# Patient Record
Sex: Female | Born: 1955 | Race: White | Hispanic: No | Marital: Single | Smoking: Never smoker
Health system: Southern US, Community
[De-identification: ages and names within clinical notes are randomized; demographics above are authoritative.]

## PROBLEM LIST (undated history)

## (undated) DIAGNOSIS — R002 Palpitations: Secondary | ICD-10-CM

## (undated) DIAGNOSIS — I319 Disease of pericardium, unspecified: Secondary | ICD-10-CM

## (undated) DIAGNOSIS — B001 Herpesviral vesicular dermatitis: Secondary | ICD-10-CM

## (undated) DIAGNOSIS — J45909 Unspecified asthma, uncomplicated: Secondary | ICD-10-CM

## (undated) DIAGNOSIS — R9389 Abnormal findings on diagnostic imaging of other specified body structures: Secondary | ICD-10-CM

## (undated) DIAGNOSIS — M542 Cervicalgia: Secondary | ICD-10-CM

## (undated) DIAGNOSIS — J309 Allergic rhinitis, unspecified: Secondary | ICD-10-CM

## (undated) DIAGNOSIS — M5030 Other cervical disc degeneration, unspecified cervical region: Secondary | ICD-10-CM

## (undated) DIAGNOSIS — R0789 Other chest pain: Secondary | ICD-10-CM

## (undated) DIAGNOSIS — F32A Depression, unspecified: Secondary | ICD-10-CM

## (undated) DIAGNOSIS — R7401 Elevation of levels of liver transaminase levels: Secondary | ICD-10-CM

## (undated) DIAGNOSIS — E559 Vitamin D deficiency, unspecified: Secondary | ICD-10-CM

## (undated) DIAGNOSIS — I4510 Unspecified right bundle-branch block: Secondary | ICD-10-CM

## (undated) DIAGNOSIS — E785 Hyperlipidemia, unspecified: Secondary | ICD-10-CM

## (undated) DIAGNOSIS — G4700 Insomnia, unspecified: Secondary | ICD-10-CM

## (undated) DIAGNOSIS — K760 Fatty (change of) liver, not elsewhere classified: Secondary | ICD-10-CM

## (undated) DIAGNOSIS — L719 Rosacea, unspecified: Secondary | ICD-10-CM

## (undated) DIAGNOSIS — R7989 Other specified abnormal findings of blood chemistry: Secondary | ICD-10-CM

## (undated) DIAGNOSIS — M8580 Other specified disorders of bone density and structure, unspecified site: Secondary | ICD-10-CM

## (undated) HISTORY — DX: Unspecified right bundle-branch block: I45.10

## (undated) HISTORY — DX: Elevation of levels of liver transaminase levels: R74.01

## (undated) HISTORY — DX: Vitamin D deficiency, unspecified: E55.9

## (undated) HISTORY — DX: Abnormal findings on diagnostic imaging of other specified body structures: R93.89

## (undated) HISTORY — DX: Allergic rhinitis, unspecified: J30.9

## (undated) HISTORY — DX: Palpitations: R00.2

## (undated) HISTORY — DX: Cervicalgia: M54.2

## (undated) HISTORY — DX: Insomnia, unspecified: G47.00

## (undated) HISTORY — DX: Rosacea, unspecified: L71.9

## (undated) HISTORY — DX: Other specified disorders of bone density and structure, unspecified site: M85.80

## (undated) HISTORY — DX: Hyperlipidemia, unspecified: E78.5

## (undated) HISTORY — DX: Other specified abnormal findings of blood chemistry: R79.89

## (undated) HISTORY — DX: Other cervical disc degeneration, unspecified cervical region: M50.30

## (undated) HISTORY — DX: Unspecified asthma, uncomplicated: J45.909

## (undated) HISTORY — DX: Other chest pain: R07.89

## (undated) HISTORY — DX: Depression, unspecified: F32.A

## (undated) HISTORY — DX: Disease of pericardium, unspecified: I31.9

## (undated) HISTORY — DX: Herpesviral vesicular dermatitis: B00.1

## (undated) HISTORY — DX: Fatty (change of) liver, not elsewhere classified: K76.0

## (undated) HISTORY — PX: LOBECTOMY: SHX5089

---

## 1997-07-10 ENCOUNTER — Ambulatory Visit (HOSPITAL_COMMUNITY): Admission: RE | Admit: 1997-07-10 | Discharge: 1997-07-10 | Payer: Self-pay | Admitting: Cardiology

## 1997-07-20 ENCOUNTER — Ambulatory Visit (HOSPITAL_COMMUNITY): Admission: RE | Admit: 1997-07-20 | Discharge: 1997-07-20 | Payer: Self-pay | Admitting: Cardiology

## 1999-01-07 ENCOUNTER — Encounter: Admission: RE | Admit: 1999-01-07 | Discharge: 1999-01-07 | Payer: Self-pay | Admitting: Family Medicine

## 1999-06-30 ENCOUNTER — Ambulatory Visit: Admission: RE | Admit: 1999-06-30 | Discharge: 1999-06-30 | Payer: Self-pay | Admitting: Family Medicine

## 1999-10-19 ENCOUNTER — Ambulatory Visit (HOSPITAL_COMMUNITY): Admission: RE | Admit: 1999-10-19 | Discharge: 1999-10-19 | Payer: Self-pay | Admitting: Family Medicine

## 1999-10-19 ENCOUNTER — Encounter: Payer: Self-pay | Admitting: Family Medicine

## 1999-11-02 ENCOUNTER — Other Ambulatory Visit: Admission: RE | Admit: 1999-11-02 | Discharge: 1999-11-02 | Payer: Self-pay | Admitting: *Deleted

## 1999-11-07 ENCOUNTER — Encounter (INDEPENDENT_AMBULATORY_CARE_PROVIDER_SITE_OTHER): Payer: Self-pay

## 1999-11-08 ENCOUNTER — Ambulatory Visit (HOSPITAL_COMMUNITY): Admission: RE | Admit: 1999-11-08 | Discharge: 1999-11-08 | Payer: Self-pay | Admitting: *Deleted

## 2000-01-10 ENCOUNTER — Encounter: Admission: RE | Admit: 2000-01-10 | Discharge: 2000-01-10 | Payer: Self-pay | Admitting: Occupational Medicine

## 2000-01-10 ENCOUNTER — Encounter: Payer: Self-pay | Admitting: Occupational Medicine

## 2000-01-16 ENCOUNTER — Encounter (INDEPENDENT_AMBULATORY_CARE_PROVIDER_SITE_OTHER): Payer: Self-pay | Admitting: Specialist

## 2000-01-17 ENCOUNTER — Inpatient Hospital Stay (HOSPITAL_COMMUNITY): Admission: RE | Admit: 2000-01-17 | Discharge: 2000-01-18 | Payer: Self-pay | Admitting: *Deleted

## 2000-01-17 ENCOUNTER — Encounter: Payer: Self-pay | Admitting: *Deleted

## 2001-01-10 ENCOUNTER — Encounter: Payer: Self-pay | Admitting: Occupational Medicine

## 2001-01-10 ENCOUNTER — Encounter: Admission: RE | Admit: 2001-01-10 | Discharge: 2001-01-10 | Payer: Self-pay | Admitting: Occupational Medicine

## 2002-01-16 ENCOUNTER — Encounter: Payer: Self-pay | Admitting: Family Medicine

## 2002-01-16 ENCOUNTER — Encounter: Admission: RE | Admit: 2002-01-16 | Discharge: 2002-01-16 | Payer: Self-pay | Admitting: Family Medicine

## 2003-04-15 ENCOUNTER — Encounter: Admission: RE | Admit: 2003-04-15 | Discharge: 2003-04-15 | Payer: Self-pay | Admitting: Occupational Medicine

## 2003-09-08 ENCOUNTER — Encounter: Admission: RE | Admit: 2003-09-08 | Discharge: 2003-09-08 | Payer: Self-pay | Admitting: Cardiology

## 2004-05-04 ENCOUNTER — Encounter: Admission: RE | Admit: 2004-05-04 | Discharge: 2004-05-04 | Payer: Self-pay | Admitting: Emergency Medicine

## 2004-08-29 ENCOUNTER — Ambulatory Visit (HOSPITAL_COMMUNITY): Admission: RE | Admit: 2004-08-29 | Discharge: 2004-08-30 | Payer: Self-pay | Admitting: Neurosurgery

## 2005-05-05 ENCOUNTER — Encounter: Admission: RE | Admit: 2005-05-05 | Discharge: 2005-05-05 | Payer: Self-pay | Admitting: Emergency Medicine

## 2005-05-26 ENCOUNTER — Encounter: Admission: RE | Admit: 2005-05-26 | Discharge: 2005-05-26 | Payer: Self-pay | Admitting: Emergency Medicine

## 2005-06-09 ENCOUNTER — Encounter: Admission: RE | Admit: 2005-06-09 | Discharge: 2005-06-09 | Payer: Self-pay | Admitting: Emergency Medicine

## 2006-04-04 ENCOUNTER — Encounter: Admission: RE | Admit: 2006-04-04 | Discharge: 2006-04-04 | Payer: Self-pay | Admitting: Family Medicine

## 2006-07-05 ENCOUNTER — Encounter: Admission: RE | Admit: 2006-07-05 | Discharge: 2006-07-05 | Payer: Self-pay | Admitting: Family Medicine

## 2006-11-09 ENCOUNTER — Encounter: Admission: RE | Admit: 2006-11-09 | Discharge: 2006-11-09 | Payer: Self-pay | Admitting: Neurosurgery

## 2007-04-08 ENCOUNTER — Encounter: Admission: RE | Admit: 2007-04-08 | Discharge: 2007-04-08 | Payer: Self-pay | Admitting: Family Medicine

## 2007-07-18 ENCOUNTER — Encounter: Admission: RE | Admit: 2007-07-18 | Discharge: 2007-07-18 | Payer: Self-pay | Admitting: Family Medicine

## 2008-02-06 ENCOUNTER — Ambulatory Visit (HOSPITAL_COMMUNITY): Admission: RE | Admit: 2008-02-06 | Discharge: 2008-02-07 | Payer: Self-pay | Admitting: Neurosurgery

## 2009-01-01 ENCOUNTER — Encounter: Admission: RE | Admit: 2009-01-01 | Discharge: 2009-01-01 | Payer: Self-pay | Admitting: Family Medicine

## 2010-01-07 ENCOUNTER — Encounter: Admission: RE | Admit: 2010-01-07 | Discharge: 2010-01-07 | Payer: Self-pay | Admitting: Family Medicine

## 2010-04-17 ENCOUNTER — Encounter: Payer: Self-pay | Admitting: Emergency Medicine

## 2010-08-09 NOTE — Op Note (Signed)
Sharon Obrien, Sharon Obrien                ACCOUNT NO.:  [id number]   MEDICAL RECORD NO.:  [id number]          PATIENT TYPE:  OIB   LOCATION:  3526                         FACILITY:  [facility]   PHYSICIAN:  Cristi Loron, M.D.DATE OF BIRTH:  1955-09-09   DATE OF PROCEDURE:  02/06/2008  DATE OF DISCHARGE:                               OPERATIVE REPORT   BRIEF HISTORY:  The patient is a 55 year old white female who I  performed a previous C5-6 anterior cervical discectomy and fusion  plating.  The patient has done well for years, but more recently has  developed chronic debilitating neck pain.  She failed medical  management.  She was worked up with a cervical MRI which demonstrated  that she had degenerative changes at C4-5 with predominantly facet  arthropathy and some spondylosis.  I discussed various treatment option  with the patient including surgery.  She has weighed the risks,  benefits, and alternatives of surgery and decided to pursue with C4-5  anterior cervical diskectomy and fusion plating.   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   PROCEDURE:  C4-5 extensive anterior cervical diskectomy/decompression;  C4-5 anterior interbody arthrodesis with local morselized autograft bone  and active fused bone graft extender; insertion of C4-C5 interbody  prosthesis (Alphatec PEEK interbody prosthesis); C4-5 anterior cervical  plating with Codman Slim-Loc titanium plate and screws.   SURGEON:  Cristi Loron, MD   ASSISTANT:  Hilda Lias, MD   ANESTHESIA:  General endotracheal.   ESTIMATED BLOOD LOSS:  50 mL.   SPECIMENS:  None.   DRAINS:  None.   COMPLICATIONS:  None.   DESCRIPTION OF PROCEDURE:  The patient was brought to the operating room  by the  anesthesia team.  General endotracheal anesthesia was induced.  The patient remained in supine position.  A roll was placed under her  shoulders and placed the neck in slight extension.  Her anterior  cervical region was then prepared with Betadine scrub and Betadine  solution.  Sterile drapes were applied.  I then injected the area to be  incised with Marcaine with epinephrine solution.  I used a scalpel to  make a transverse incision in the patient's left anterior neck through  her previous surgical scar.  I used a Metzenbaum scissors to divide the  platysma muscle and then to carefully dissect through the previous scar  tissue from the previous operation.  We carefully dissected medial to  sternocleidomastoid muscle, jugular vein, and carotid artery.  I  carefully dissected down towards the anterior cervical spine identifying  the esophagus and retracting it medially.  We then used skin swabs to  clear soft tissue from the anterior cervical spine and exposing previous  plate at Z6-1.  We removed the scar tissue over the plate using the  nerve hooks and the 15-blade scalpel.  We then unlocked the cams to  remove the screws and then remove the plate.  There was a good  arthrodesis at this  level.   We now turned attention to decompression at C4-5.  We used  electrocautery to detach the medial border of the longus colli muscle  bilaterally from C4-5 intervertebral space.  We then inserted the Caspar  self-retaining retractor underneath the longus colli muscle bilaterally  to provide exposure.  We then began the decompression by incising the C4-  5 intervertebral disk.  It was collapsed and spondylotic.  We performed  a partial intervertebral diskectomy using the pituitary forceps and the  Karlin curettes.  We then inserted distraction screws at C4 and C5,  distracted interspace and then used high-speed drill to decorticate the  vertebral plates at O1-3, drill away the remainder C4-5  intervertebral  disk, to drill away some posterior spondylosis and to thin out the  postlongitudinal ligament.  We then incised the ligament with arachnoid  knife and then removed it with a Kerrison punch undercutting the  vertebral endplates decompressing the thecal sac.  We then performed  foraminotomies about the bilateral C5 nerve root completing the  decompression at this level.   We now turned attention to arthrodesis.  We used a trial spacers and  determined to use a medium 5-mm PEEK interbody prosthesis.  We prefilled  this prosthesis with a combination of local autograft bone we obtained  during the decompression as well as active fused bone graft extender.  We inserted the prosthesis into the distracted C4-5 interspace and then  removed distraction screws.  There was a good snug fit of the prosthesis  in the interspace.   We now turned our attention to the anterior spinal instrumentation.  We  used a high-speed drill to remove some ventral spondylosis from the  vertebral endplates of C4-5, so that the plate would lay down flat.  We  selected the appropriate length Codman Slim-Loc anterior cervical plate  laid along the anterior aspect of her vertebral bodies at C4 and C5.  We  used the old screw holes at C5 and inserted two 12-mm self-tapping  screws at C5.  We then drilled two new 12-mm holes at C4 and then placed  two 12-mm self-tapping screws at C4.  We obtained intraoperative  radiograph that demonstrated good position of plate, screws interbody  prosthesis.  We therefore secured these screws to the plate by locking  each cam.   We then obtained hemostasis using bipolar electrocautery.  We irrigated  the wound out with bacitracin solution.  We then removed the retractors.  We inspected the esophagus for any damage and there was none apparent.  We then reapproximated the patient's platysma muscle with interrupted 3-  0 Vicryl suture, subcutaneous tissue with interrupted  3-0 Vicryl suture,  and the skin with Steri-Strips and Benzoin.  The wound was then covered  with bacitracin ointment.  A sterile dressing was applied.  The drapes  were removed and the patient was subsequently extubated by the  Anesthesia team and transported to Post Anesthesia Care Unit in stable  condition.  All sponge, instrument, and needle counts were correct at  the end of this case.      Cristi Loron, M.D.  Electronically Signed     JDJ/MEDQ  D:  02/06/2008  T:  02/07/2008  Job:  [id number]
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
## 2010-08-12 NOTE — Op Note (Signed)
NAMEGEORGENE, Sharon Obrien                ACCOUNT NO.:  [id number]   MEDICAL RECORD NO.:  [id number]          PATIENT TYPE:  OIB   LOCATION:  3015                         FACILITY:  [facility]   PHYSICIAN:  Cristi Loron, M.D.DATE OF BIRTH:  1955-06-30   DATE OF PROCEDURE:  08/29/2004  DATE OF DISCHARGE:                                 OPERATIVE REPORT   BRIEF HISTORY:  The patient is a 55 year old white female who suffers from  neck and left arm pain.  She has failed medical management and was worked up  with a cervical MRI which demonstrated cervical spondylosis and stenosis at  C5-C6.  I discussed the various treatment options with the patient including  surgery.  The patient has weighed the risks, benefits, and alternatives of  surgery and decided to proceed with a C5-C6 anterior cervical discectomy,  fusion, and plating.   PREOPERATIVE DIAGNOSIS:  C5-C6 disc degeneration, spondylosis, stenosis,  cervical radiculopathy, cervicalgia.   POSTOPERATIVE DIAGNOSIS:  C5-C6 disc degeneration, spondylosis, stenosis,  cervical radiculopathy, cervicalgia.   PROCEDURE:  C5-C6 extensive anterior cervical discectomy/decompression;  interbody iliac crest allograft arthrodesis; anterior cervical plating  (Codman Slimlock titanium plate and screws).   SURGEON:  Cristi Loron, M.D.   ASSISTANT:  Hilda Lias, M.D.   ANESTHESIA:  General endotracheal anesthesia.   ESTIMATED BLOOD LOSS:  50 mL.   SPECIMENS:  None.   DRAINS:  None.   COMPLICATIONS:  None.   DESCRIPTION OF PROCEDURE:  The patient was brought to the operating room by  the anesthesia team.  General endotracheal anesthesia was induced.  The  patient remained in the supine position.  A roll was placed under her  shoulders to place her neck in slight extension.  Her anterior cervical  region was then prepared with Betadine scrub and Betadine solution.  Sterile  drapes were applied.  I then injected the area to be incised  with Marcaine  with epinephrine solution.  I used a scalpel to make a transverse incision  in the patient's left anterior neck.  I used the Metzenbaum scissors to  divide the platysmal muscle and to dissect medial to the sternocleidomastoid  muscle, jugular vein, and carotid artery.  I bluntly dissected down towards  the anterior cervical spine, carefully identified the esophagus and  retracted it medially.  I cleared the soft tissue from the anterior cervical  spine and inserted a bent spinal needle into the upper exposed  intravertebral disc space.  I obtained an interoperative radiograph to  confirm our location.   We then used electrocautery to detach the medial border of the longus colli  muscles bilaterally from C5-C6 intervertebral disc space.  We inserted the  Caspar self-retaining retractor for exposure.  We incised the C5-C6  intervertebral disc and performed a partial discectomy using Carlens curets  and pituitary forceps.  We then inserted distraction screws at C5 and C6 and  distracted the interspace.  We used the high speed drill to decorticate the  vertebral endplates at C5-C6, drill away the remainder of the C5-C6  intervertebral disc, and drill away some posterior  spondylosis, and to thin  out the posterior longitudinal ligament.  We then incised the ligament with  the arachnoid knife and then removed it with the Kerrison punch under  cutting the vertebral endplate at C5-C6, decompressing the thecal sac.  We  then performed a generous foraminotomy about the bilateral C6 nerve root.  Of note, there was significant foraminal stenosis secondary to spondylosis.  We got a good decompression of the C6 nerve root bilaterally completing the  decompression.   We now turned our attention to the arthrodesis.  We obtained iliac crest  tricortical allograft bone graft and fashioned it to the dimensions of 7 mm  height and 1 cm depth.  We inserted the bone graft in the distracted  C5-C6  interspace, removed the distraction screws, there was a good snug fit of  bone graft.   We now turned our attention to the anterior spine instrumentation.  We used  a high speed drill to drill away some ventral spondylosis from the C5-C6  intervertebral disc space so that the plate would lie flat.  We selected the  appropriate length Codman Slimlock anterior cervical plate, laid it on the  anterior aspect of C5 to C6, we drilled two holes into C5 and two at C6.  We  secured the plate to the vertebral bodies by placing two 12 mm self-tapping  screws at C5, two at C6.  We obtained an interoperative radiograph.  There  was limited visualization of the lower plate and screws because of the  patient's body habitus, however, they looked good en vivo.  We secured the  screws to the plate by locking each cam and completing the instrumentation.   We then obtained stringent hemostasis using bipolar electrocautery.  We  copiously irrigated the wound out with Bacitracin solution and removed the  solution.  We then removed the Caspar self-retaining retractor.  We  inspected the esophagus for any damage and there was none apparent.  We then  reapproximated the patient's platysmal muscle with interrupted 3-0 Vicryl  suture, the subcutaneous tissue with interrupted 3-0 Vicryl suture, and the  skin with Steri-Strips and Benzoin.  The wound was then coated with  Bacitracin ointment.  A sterile dressing was applied, the drapes were  removed.  The patient was subsequently extubated by the anesthesia team and  transported to the post anesthesia care unit in stable condition.  All  sponge, instrument and needle counts were correct at the end of the case.       JDJ/MEDQ  D:  08/29/2004  T:  08/29/2004  Job:  [id number]

## 2010-08-12 NOTE — Op Note (Signed)
[facility]  Patient:    Sharon Obrien, Sharon Obrien                       MRN: [id number] Proc. Date: 01/16/00 Adm. Date:  [id number] Attending:  Marina Gravel B                           Operative Report  PREOPERATIVE DIAGNOSIS:       Menorrhagia, unresponsive to medical and                               conservative surgical therapy.  POSTOPERATIVE DIAGNOSIS:      Menorrhagia, unresponsive to medical and                               conservative surgical therapy.  OPERATION:                    Laparoscopically-assisted vaginal hysterectomy.  SURGEON:                      Marina Gravel, M.D.  ASSISTANT:                    Lenoard Aden, M.D.  ANESTHESIA:                   General.  FINDINGS:                     Exam under anesthesia, slightly enlarged, approximately 8-week size uterus with smooth contours.  No adnexal masses palpable.  Rectovaginal exam confirmed.  OPERATIVE FINDINGS:           Homogeneously enlarged uterus, approximately eight weeks size.  Adhesions of the bladder to the anterior lower uterine segment.  Normal tubes and ovaries.  No other pelvic adhesions.  Omental adhesions to the stomach and right upper quadrant.  ESTIMATED BLOOD LOSS:         250 cc.  URINE OUTPUT:                 350 cc.  FLUIDS:                       2200 cc.  COMPLICATIONS:                None.  DRAINS:                       Foley.  DESCRIPTION OF PROCEDURE:     The patient was taken to the operating room and general anesthesia obtained.  She was placed in the ski position, and prepped and draped in the standard fashion.  A Foley catheter was inserted in the bladder.  A speculum was inserted in the vagina and the anterior lip of the cervix grasped with a tooth tenaculum.  A Hulka tenaculum was then inserted. All other instruments were then removed from the vagina.                                Attention was then turned to the abdomen where a 10 mm  vertical infraumbilical skin fold incision was made with the knife. This was carried  sharply to the underlying fascia.  The fascia was elevated with Kocher clamps and entered sharply.  The angles of the fascial incision were sutured with 0 Vicryl in a pursestring fashion.  The Hasson cannula was then inserted and the angled suture attached to stabilize the cannula. Intra-abdominal placement was confirmed with the laparoscope. Pneumoperitoneum was obtained with CO2 gas.                                Five millimeter ports were inserted bilaterally, approximately 3 cm off the symphysis and 8 cm off the midline.  Each was performed under direct visualization with the laparoscope.                                The left round ligament was identified and placed on traction.  It was divided with the tripolar cautery.  The dissection was continued anteriorly to begin to develop the bladder flap.  A similar procedure was repeated on the opposite side.  The utero-ovarian ligaments were then isolated, and cauterized, and divided with the tripolar.  Hemostasis was obtained.  The bladder flap was then created with sharp dissection with Endoshears.  Adhesions were noted from the bladder to the anterior lower uterine segment.  These were dissected sharply without difficulty.  We then decided to proceed with the vaginal portion of the procedure.  Therefore, the scope was removed and gas released from the abdomen.                                Attention was then turned to the vagina.  A weighted speculum was inserted and the Hulka tenaculum removed.  The anterior and posterior lip of the cervix were each grasped with a Jacobs tenaculum. The vaginal mucosa was circumscribed with the Bovie just below the level of the internal cervical os.  The vagina and underlying tissue were dissected sharply and the bladder moved caudad.                                The posterior cul-de-sac was then  entered sharply.  A weighted speculum was then inserted into the cul-de-sac.  Each uterosacral ligament was then clamped with a curved Heaney clamp, divided, and suture ligated with 0 Vicryl, and hemostasis obtained.  The cardinal ligaments were then clamped on each side with curved Heaney clamp, divided, and suture ligated with 0 Vicryl, and hemostasis obtained.  The anterior cul-de-sac was then entered sharply, and a curved Beaver retractor placed into the anterior cul-de-sac.  The remainder of the cardinal ligaments were then serially clamped, divided, and suture ligated with 0 Vicryl, and hemostasis obtained. This was continued to the level of the previously dissected utero-ovarian ligaments.  The uterus and cervix were then delivered as an intact specimen. Vaginal angles were then closed to incorporate the uterosacral ligament with a figure-of-eight suture of 0 Vicryl.  The remainder of the vaginal cuff was closed with figure-of-eight sutures of 0 Vicryl.  Hemostasis was obtained.  A 2 inch gauze coated in estrogen cream was then packed into the vagina.  Gloves were changed and attention returned to the abdomen.  Pneumoperitoneum was reobtained with CO2 gas.  The vaginal cuff and utero-ovarian pedicles were inspected and were hemostatic.  The pelvis was irrigated and no other bleeding identified.                                Of note, prior to the dissection of the utero-ovarian ligaments laparoscopically, the course of the ureters was identified on each side, and were noted to be well away from the area of dissection.                                The inferior trocars were removed under laparoscopic visualization.  Their sites were hemostatic.  The scope was removed and gas released.  The scope was reinserted and the scope and sleeve removed as a single unit.  A finger was inserted through the fascial defect and there was no bowel palpable in the  fascia.  The previously placed pursestring suture was then cinched down to close the fascial defect. Subcutaneous 4-0 interrupted sutures were placed in the umbilical port to  reapproximate the skin edges.  The skin was then closed with Dermabond.  The inferior port incisions were then closed as follows; the edges reapproximated with pickups with teeth, and the skin closed with Dermabond.                                The patient tolerated the procedure well and there were complications.  She was taken to the recovery room, awake, alert, and in stable condition.  All counts were correct per the operating room staff. DD:  01/16/00 TD:  01/16/00 Job: [id number]
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
## 2010-08-12 NOTE — Op Note (Signed)
[facility]  Patient:    Sharon Obrien, Sharon Obrien                       MRN: [id number] Proc. Date: 11/08/99 Adm. Date:  [id number] Attending:  Marina Gravel B                           Operative Report  PREOPERATIVE DIAGNOSIS:       Abnormal uterine bleeding, thickened endometrial                               stripe, and ultrasound findings consistent with                               endometrial polyp.  POSTOPERATIVE DIAGNOSIS:      Shaggy endometrium and probable endometrial                               polyp.  OPERATION:                    Hysteroscopy and D&C.  SURGEON:                      Marina Gravel, M.D.  ANESTHESIA:                   LMA.  FINDINGS:                     Shaggy endometrium, probable endometrial polyp.  COMPLICATIONS:                None.  ESTIMATED BLOOD LOSS:         50 cc.  DISTENTION MEDIA:             Sorbitol.  FLUID DEFICIT:                90 cc.  DESCRIPTION OF PROCEDURE:     The patient was taken to the operating room and LMA anesthesia obtained.  She was placed in the ski position, and prepped and draped in a standard fashion.  The bladder was emptied with a straight catheter.  The patient was examined under anesthesia and a normal size anteverted uterus was palpable.  No adnexal masses were noted.                                A weight speculum was placed in the vagina and the anterior lip of the cervix was grasped with a tooth tenaculum.  The cervix was then dilated without difficulty to allow passage of the [facility] diagnostic hysteroscope.  The hysteroscope was inserted and the uterine cavity distended with sorbitol.  The above findings were noted.  Polyp forceps were used to remove the endometrial polyp noted at approximately 9 oclock position, just distal to the internal cervical os.  Then, the uterus was gently curetted until a gritty texture was noted throughout.  The hysteroscope was returned and the  previous polyp was noted to be removed.  No additional intrauterine masses were seen.  A slight amount of shaggy endometrium remained.  The hysteroscope was removed and curet passed one more time and a  gritty texture noted again.  Therefore, the procedure was terminated.                                The patient tolerated the procedure well and there were no complications.  She was taken to the recovery room awake, alert, and in stable condition. DD:  11/08/99 TD:  11/08/99 Job: [id number]
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
## 2010-09-11 ENCOUNTER — Emergency Department (HOSPITAL_COMMUNITY)
Admission: EM | Admit: 2010-09-11 | Discharge: 2010-09-11 | Disposition: A | Discharge status: Home / Self Care | Payer: Federal, State, Local not specified - PPO | Attending: Emergency Medicine | Admitting: Emergency Medicine

## 2010-09-11 ENCOUNTER — Emergency Department (HOSPITAL_COMMUNITY): Payer: Federal, State, Local not specified - PPO

## 2010-09-11 DIAGNOSIS — IMO0002 Reserved for concepts with insufficient information to code with codable children: Secondary | ICD-10-CM | POA: Insufficient documentation

## 2010-09-11 DIAGNOSIS — M5430 Sciatica, unspecified side: Secondary | ICD-10-CM | POA: Insufficient documentation

## 2010-09-11 DIAGNOSIS — M549 Dorsalgia, unspecified: Secondary | ICD-10-CM | POA: Insufficient documentation

## 2010-09-11 DIAGNOSIS — M79609 Pain in unspecified limb: Secondary | ICD-10-CM | POA: Insufficient documentation

## 2010-09-11 DIAGNOSIS — R209 Unspecified disturbances of skin sensation: Secondary | ICD-10-CM | POA: Insufficient documentation

## 2010-12-27 LAB — BASIC METABOLIC PANEL
Calcium: 9.4
Chloride: 103
GFR calc non Af Amer: >60
Glucose, Bld: 86
Potassium: 4.5
Sodium: 138

## 2011-04-24 ENCOUNTER — Other Ambulatory Visit: Payer: Self-pay | Admitting: Family Medicine

## 2011-04-24 DIAGNOSIS — R945 Abnormal results of liver function studies: Secondary | ICD-10-CM

## 2011-04-27 ENCOUNTER — Other Ambulatory Visit: Payer: Self-pay | Admitting: Family Medicine

## 2011-04-27 ENCOUNTER — Ambulatory Visit
Admission: RE | Admit: 2011-04-27 | Discharge: 2011-04-27 | Disposition: A | Discharge status: Home / Self Care | Payer: Federal, State, Local not specified - PPO | Source: Ambulatory Visit | Attending: Family Medicine | Admitting: Family Medicine

## 2011-04-27 DIAGNOSIS — R945 Abnormal results of liver function studies: Secondary | ICD-10-CM

## 2011-05-15 ENCOUNTER — Other Ambulatory Visit: Payer: Self-pay | Admitting: Physical Medicine and Rehabilitation

## 2011-05-15 DIAGNOSIS — M545 Low back pain, unspecified: Secondary | ICD-10-CM

## 2011-05-17 ENCOUNTER — Ambulatory Visit
Admission: RE | Admit: 2011-05-17 | Discharge: 2011-05-17 | Disposition: A | Discharge status: Home / Self Care | Payer: Federal, State, Local not specified - PPO | Source: Ambulatory Visit | Attending: Physical Medicine and Rehabilitation | Admitting: Physical Medicine and Rehabilitation

## 2011-05-17 DIAGNOSIS — M545 Low back pain, unspecified: Secondary | ICD-10-CM

## 2012-01-16 ENCOUNTER — Other Ambulatory Visit: Payer: Self-pay | Admitting: Family Medicine

## 2012-01-16 DIAGNOSIS — Z1231 Encounter for screening mammogram for malignant neoplasm of breast: Secondary | ICD-10-CM

## 2012-02-20 ENCOUNTER — Ambulatory Visit
Admission: RE | Admit: 2012-02-20 | Discharge: 2012-02-20 | Disposition: A | Discharge status: Home / Self Care | Payer: Federal, State, Local not specified - PPO | Source: Ambulatory Visit | Attending: Family Medicine | Admitting: Family Medicine

## 2012-02-20 DIAGNOSIS — Z1231 Encounter for screening mammogram for malignant neoplasm of breast: Secondary | ICD-10-CM

## 2012-02-21 ENCOUNTER — Ambulatory Visit: Payer: Federal, State, Local not specified - PPO

## 2012-04-15 ENCOUNTER — Other Ambulatory Visit: Payer: Self-pay | Admitting: Internal Medicine

## 2012-04-16 ENCOUNTER — Other Ambulatory Visit: Payer: Self-pay | Admitting: Internal Medicine

## 2012-04-16 DIAGNOSIS — R7401 Elevation of levels of liver transaminase levels: Secondary | ICD-10-CM

## 2012-05-07 ENCOUNTER — Ambulatory Visit
Admission: RE | Admit: 2012-05-07 | Discharge: 2012-05-07 | Disposition: A | Discharge status: Home / Self Care | Payer: Federal, State, Local not specified - PPO | Source: Ambulatory Visit | Attending: Internal Medicine | Admitting: Internal Medicine

## 2012-05-07 DIAGNOSIS — R7401 Elevation of levels of liver transaminase levels: Secondary | ICD-10-CM

## 2013-07-02 ENCOUNTER — Other Ambulatory Visit: Payer: Self-pay

## 2013-07-02 DIAGNOSIS — Z803 Family history of malignant neoplasm of breast: Secondary | ICD-10-CM

## 2013-07-02 DIAGNOSIS — Z1231 Encounter for screening mammogram for malignant neoplasm of breast: Secondary | ICD-10-CM

## 2013-07-23 ENCOUNTER — Encounter (INDEPENDENT_AMBULATORY_CARE_PROVIDER_SITE_OTHER): Payer: Self-pay

## 2013-07-23 ENCOUNTER — Ambulatory Visit
Admission: RE | Admit: 2013-07-23 | Discharge: 2013-07-23 | Disposition: A | Discharge status: Home / Self Care | Payer: Federal, State, Local not specified - PPO | Source: Ambulatory Visit

## 2013-07-23 DIAGNOSIS — Z1231 Encounter for screening mammogram for malignant neoplasm of breast: Secondary | ICD-10-CM

## 2013-07-23 DIAGNOSIS — Z803 Family history of malignant neoplasm of breast: Secondary | ICD-10-CM

## 2014-08-07 ENCOUNTER — Ambulatory Visit (INDEPENDENT_AMBULATORY_CARE_PROVIDER_SITE_OTHER): Payer: Federal, State, Local not specified - PPO

## 2014-08-07 ENCOUNTER — Ambulatory Visit (INDEPENDENT_AMBULATORY_CARE_PROVIDER_SITE_OTHER): Payer: Federal, State, Local not specified - PPO | Admitting: Podiatry

## 2014-08-07 VITALS — BP 119/75 | HR 68 | Resp 15

## 2014-08-07 DIAGNOSIS — M79672 Pain in left foot: Secondary | ICD-10-CM

## 2014-08-07 DIAGNOSIS — M2042 Other hammer toe(s) (acquired), left foot: Secondary | ICD-10-CM | POA: Diagnosis not present

## 2014-08-07 DIAGNOSIS — M779 Enthesopathy, unspecified: Secondary | ICD-10-CM

## 2014-08-07 MED ORDER — TRIAMCINOLONE ACETONIDE 10 MG/ML IJ SUSP
10.0000 mg | Freq: Once | INTRAMUSCULAR | Status: AC
  Administered 2014-08-07: 10 mg

## 2014-08-07 NOTE — Progress Notes (Signed)
   Subjective:    Patient ID: Sharon Obrien, female    DOB: 06/20/1955, 59 y.o.   MRN: [id number]  HPI  Pt presents with left foot pain in ball of foot, ongoing also claims to have injured it in the past and relates the numbeness on the lateral side of the left foot to that injury  Review of Systems  All other systems reviewed and are negative.      Objective:   Physical Exam        Assessment & Plan:

## 2014-08-09 NOTE — Progress Notes (Signed)
Subjective:     Patient ID: Sharon Obrien, female   DOB: 04/11/1955, 59 y.o.   MRN: [id number]  HPI patient presents stating I have had a lot of pain on the ball of my left foot and I'm having difficulty walking on it. States that this seems like the second toe has moved a little bit and she does not remember specific injury but it's been present for at least 6 months   Review of Systems  All other systems reviewed and are negative.      Objective:   Physical Exam  Constitutional: She is oriented to person, place, and time.  Cardiovascular: Intact distal pulses.   Musculoskeletal: Normal range of motion.  Neurological: She is oriented to person, place, and time.  Skin: Skin is warm.  Nursing note and vitals reviewed.  neurovascular status intact muscle strength was found to be adequate with range of motion within normal limits. Patient is moderate depression of the arch and is noted on the left foot to have medial deviation of the second toe with inflammation and fluid within the second metatarsophalangeal joint upon palpation. Patient has good digital perfusion and is well oriented 3     Assessment:     Probable flexor plate disruption with inflammatory capsulitis second MPJ of the left foot    Plan:     H&P and x-rays reviewed with patient. Today I went ahead and I did a careful proximal block and then explaining risk of injection which patient wants and injected and aspirated the second MPJ getting out of small amount of clear fluid followed by a infiltration of the joint of 1/4 mL of dexamethasone Kenalog. Applied thick plantar pad and discussed long-term orthotics or the possibility this could ultimately require surgery

## 2014-08-26 ENCOUNTER — Ambulatory Visit (INDEPENDENT_AMBULATORY_CARE_PROVIDER_SITE_OTHER): Payer: Federal, State, Local not specified - PPO | Admitting: Podiatry

## 2014-08-26 VITALS — BP 115/67 | HR 78 | Resp 12

## 2014-08-26 DIAGNOSIS — M779 Enthesopathy, unspecified: Secondary | ICD-10-CM

## 2014-08-26 NOTE — Patient Instructions (Signed)
Pre-Operative Instructions  Congratulations, you have decided to take an important step to improving your quality of life.  You can be assured that the doctors of [facility] will be with you every step of the way.  1. Plan to be at the surgery center/hospital at least 1 (one) hour prior to your scheduled time unless otherwise directed by the surgical center/hospital staff.  You must have a responsible adult accompany you, remain during the surgery and drive you home.  Make sure you have directions to the surgical center/hospital and know how to get there on time. 2. For hospital based surgery you will need to obtain a history and physical form from your family physician within 1 month prior to the date of surgery- we will give you a form for you primary physician.  3. We make every effort to accommodate the date you request for surgery.  There are however, times where surgery dates or times have to be moved.  We will contact you as soon as possible if a change in schedule is required.   4. No Aspirin/Ibuprofen for one week before surgery.  If you are on aspirin, any non-steroidal anti-inflammatory medications (Mobic, Aleve, Ibuprofen) you should stop taking it 7 days prior to your surgery.  You make take Tylenol  For pain prior to surgery.  5. Medications- If you are taking daily heart and blood pressure medications, seizure, reflux, allergy, asthma, anxiety, pain or diabetes medications, make sure the surgery center/hospital is aware before the day of surgery so they may notify you which medications to take or avoid the day of surgery. 6. No food or drink after midnight the night before surgery unless directed otherwise by surgical center/hospital staff. 7. No alcoholic beverages 24 hours prior to surgery.  No smoking 24 hours prior to or 24 hours after surgery. 8. Wear loose pants or shorts- loose enough to fit over bandages, boots, and casts. 9. No slip on shoes, sneakers are best. 10. Bring  your boot with you to the surgery center/hospital.  Also bring crutches or a walker if your physician has prescribed it for you.  If you do not have this equipment, it will be provided for you after surgery. 11. If you have not been contracted by the surgery center/hospital by the day before your surgery, call to confirm the date and time of your surgery. 12. Leave-time from work may vary depending on the type of surgery you have.  Appropriate arrangements should be made prior to surgery with your employer. 13. Prescriptions will be provided immediately following surgery by your doctor.  Have these filled as soon as possible after surgery and take the medication as directed. 14. Remove nail polish on the operative foot. 15. Wash the night before surgery.  The night before surgery wash the foot and leg well with the antibacterial soap provided and water paying special attention to beneath the toenails and in between the toes.  Rinse thoroughly with water and dry well with a towel.  Perform this wash unless told not to do so by your physician.  Enclosed: 1 Ice pack (please put in freezer the night before surgery)   1 Hibiclens skin cleaner   Pre-op Instructions  If you have any questions regarding the instructions, do not hesitate to call our office.  [place]: [address] [place], [place] [address] [phone]  [place]: [address] [place], [place] [address] [phone]  [place]: [address]  , [place] [address] [phone]  Dr. Richard   Tuchman DPM, Dr. Norman Regal DPM Dr. Richard Sikora DPM, Dr. M. Todd Hyatt DPM, Dr. Kathryn Egerton DPM 

## 2014-08-26 NOTE — Progress Notes (Signed)
Subjective:     Patient ID: Sharon Obrien, female   DOB: 12/01/1955, 59 y.o.   MRN: [id number]  HPI patient states I can't take the pain in my left foot and something we'll have to be done. Responded for a little while to the injection but is quite sore and I cannot be on my foot for a period of time it's been going on for around a year   Review of Systems     Objective:   Physical Exam Neurovascular status intact muscle strength adequate with continued discomfort left second MPJ with fluid buildup within the joint and pain when palpated    Assessment:     Inflammatory capsulitis second MPJ left with fluid buildup noted    Plan:     H&P and condition reviewed with patient. At this point we discussed orthotics versus shortening osteotomy and I do think long-term she needs orthotics but she is tired of the pain and wants to proceed with surgery. I allowed her to read consent form reviewing alternative treatments and complications and after extensive review she signed consent form understanding all possible complications. Patient is scheduled for shortening osteotomy second metatarsal left with screw fixation and is dispensed air fracture walker with instructions on usage

## 2014-08-31 ENCOUNTER — Telehealth: Payer: Self-pay | Admitting: Podiatry

## 2014-08-31 NOTE — Telephone Encounter (Signed)
Pt called and needs to reschedule her surgery date from 6/21 with Dr Paulla Dolly. She has to take mom to cancer doctor for bone marrow biopsy.

## 2014-09-02 NOTE — Telephone Encounter (Signed)
07/31/2014  "I have a surgical appointment scheduled for 09/15/2014 with Dr. Charlsie Merlesegal.  I need to reschedule to hopefull the following week.  I have a family emergency and will have to be out of town.  Please give me a call."  08/02/2014  I returned her call and informed her we can switch her to 09/22/2014.  "That will be great.  I'm so sorry, my mother has Cancer and has several appointments at the surgical center.  Can they do it around 9am?  That's when my son will be available, he works 3rd shift."  It will be sometime that morning.  I cannot give you an exact time.  The surgical center normally calls a day or 2 before and will let you know the exact arrival time.  "Okay, thank you for returning my call."

## 2014-09-22 ENCOUNTER — Encounter: Payer: Self-pay | Admitting: *Deleted

## 2014-09-22 DIAGNOSIS — M21542 Acquired clubfoot, left foot: Secondary | ICD-10-CM | POA: Diagnosis not present

## 2014-09-23 ENCOUNTER — Telehealth: Payer: Self-pay | Admitting: *Deleted

## 2014-09-23 NOTE — Telephone Encounter (Addendum)
Pt states she had surgery 09/21/2104 and had questions.  Left message to call again with questions.  Pt asked if had to keep her shoe on.  I told pt to keep the boot on at all times.

## 2014-09-25 ENCOUNTER — Telehealth: Payer: Self-pay | Admitting: *Deleted

## 2014-09-25 NOTE — Progress Notes (Signed)
[facility] for Metatarsal Osteotomy 2nd left, diagnosis Plantar Flexed Metatarsal.

## 2014-09-25 NOTE — Telephone Encounter (Signed)
I'm calling to see how you're doing.  "I'm doing good.  I've just been laying around.  I'm not used to doing nothing.  My son keeps telling me mama sit down.  I'm used to doing things."  Dr. Charlsie Merlesegal says it's okay to be up on it for 15 minutes per hour.  Keep it elevated as much as possible.  How's the pain medication doing for you?  "I've hardly even taken it.  I have a high pain tolerance."  How was your experience at the surgical center?  "I was good, everybody was nice and the facility was nice. I've recommended them as well as Dr.Regal to my friends that are having similar problems."  We thank you so much.  If you have any questions or concerns over the weekend call the on-call pager.  "Okay, I will.  Have a fun 4th of July."  Same to you.

## 2014-09-29 ENCOUNTER — Other Ambulatory Visit: Payer: Federal, State, Local not specified - PPO

## 2014-10-05 ENCOUNTER — Ambulatory Visit (INDEPENDENT_AMBULATORY_CARE_PROVIDER_SITE_OTHER): Payer: Federal, State, Local not specified - PPO

## 2014-10-05 ENCOUNTER — Ambulatory Visit (INDEPENDENT_AMBULATORY_CARE_PROVIDER_SITE_OTHER): Payer: Federal, State, Local not specified - PPO | Admitting: Podiatry

## 2014-10-05 VITALS — BP 127/76 | HR 68 | Resp 12

## 2014-10-05 DIAGNOSIS — Z9889 Other specified postprocedural states: Secondary | ICD-10-CM

## 2014-10-05 DIAGNOSIS — M779 Enthesopathy, unspecified: Secondary | ICD-10-CM

## 2014-10-06 NOTE — Progress Notes (Signed)
Subjective:     Patient ID: Sharon Obrien, female   DOB: 12/23/1955, 59 y.o.   MRN: [id number]  HPI patient states my foot is doing well with minimal edema and I'm able to walk without significant pain   Review of Systems     Objective:   Physical Exam Neurovascular status intact negative Homan sign was noted with well-healed surgical site second metatarsal left    Assessment:     Doing well post osteotomy second metatarsal left foot    Plan:     Reviewed condition and recommended anti-inflammatory's to be taken as needed and reapplied sterile dressing and dispensed surgical shoe with instructions on walking as much as she is comfortable. Reappoint in 3 weeks or earlier if necessary

## 2014-11-02 ENCOUNTER — Ambulatory Visit (INDEPENDENT_AMBULATORY_CARE_PROVIDER_SITE_OTHER): Payer: Federal, State, Local not specified - PPO

## 2014-11-02 ENCOUNTER — Ambulatory Visit (INDEPENDENT_AMBULATORY_CARE_PROVIDER_SITE_OTHER): Payer: Federal, State, Local not specified - PPO | Admitting: Podiatry

## 2014-11-02 VITALS — BP 114/69 | HR 50 | Resp 15

## 2014-11-02 DIAGNOSIS — M2042 Other hammer toe(s) (acquired), left foot: Secondary | ICD-10-CM | POA: Diagnosis not present

## 2014-11-02 DIAGNOSIS — Z9889 Other specified postprocedural states: Secondary | ICD-10-CM

## 2014-11-03 NOTE — Progress Notes (Signed)
Subjective:     Patient ID: Sharon Obrien, female   DOB: 1955/10/09, 59 y.o.   MRN: [id number]  HPI patient states she's having minimal discomfort and walking well   Review of Systems     Objective:   Physical Exam Neurovascular status intact wound edges are well coapted and second metatarsals and good alignment along with second toe    Assessment:     Doing well post osteotomy second metatarsal left    Plan:     Advised on physical therapy and anti-inflammatory's and supportive shoe gear. Reviewed final x-rays and reappoint as needed

## 2014-12-14 ENCOUNTER — Encounter: Payer: Self-pay | Admitting: Podiatry

## 2014-12-14 ENCOUNTER — Ambulatory Visit (INDEPENDENT_AMBULATORY_CARE_PROVIDER_SITE_OTHER): Payer: Federal, State, Local not specified - PPO | Admitting: Podiatry

## 2014-12-14 ENCOUNTER — Ambulatory Visit (INDEPENDENT_AMBULATORY_CARE_PROVIDER_SITE_OTHER): Payer: Federal, State, Local not specified - PPO

## 2014-12-14 VITALS — BP 144/84 | HR 56 | Resp 16

## 2014-12-14 DIAGNOSIS — M205X2 Other deformities of toe(s) (acquired), left foot: Secondary | ICD-10-CM

## 2014-12-14 DIAGNOSIS — Z9889 Other specified postprocedural states: Secondary | ICD-10-CM

## 2014-12-14 DIAGNOSIS — M779 Enthesopathy, unspecified: Secondary | ICD-10-CM | POA: Diagnosis not present

## 2014-12-14 DIAGNOSIS — M2042 Other hammer toe(s) (acquired), left foot: Secondary | ICD-10-CM

## 2014-12-14 MED ORDER — TRIAMCINOLONE ACETONIDE 10 MG/ML IJ SUSP
10.0000 mg | Freq: Once | INTRAMUSCULAR | Status: AC
  Administered 2014-12-14: 10 mg

## 2014-12-14 NOTE — Progress Notes (Signed)
Subjective:     Patient ID: Sharon Obrien, female   DOB: 1955-09-22, 58 y.o.   MRN: [id number]  HPI patient presents stating I have had some pain in my left foot and I wanted to see about what might be causing   Review of Systems     Objective:   Physical Exam Neurovascular status intact muscle strength adequate with discomfort more around the lateral side of the left first MPJ with the second MPJ that we worked on being mildly tender but improved from prior to surgery    Assessment:     May be a mild amount of arthritis in the lateral side of the first MPJ left versus some other form of inflammatory condition secondary to increased blood flow into the foot    Plan:     Reviewed conditions and did a careful injection of the lateral side of the first MPJ 3 mg Kenalog 5 mg Xylocaine and advised on physical therapy. I then went ahead and I reviewed her x-rays with her

## 2014-12-14 NOTE — Progress Notes (Signed)
Subjective:     Patient ID: Sharon Obrien, female   DOB: 07/02/1955, 59 y.o.   MRN: [id number]  HPI already completed   Review of Systems     Objective:   Physical Exam     Assessment:     Already completed    Plan:     Already completed

## 2014-12-21 ENCOUNTER — Other Ambulatory Visit: Payer: Self-pay

## 2014-12-21 DIAGNOSIS — Z1231 Encounter for screening mammogram for malignant neoplasm of breast: Secondary | ICD-10-CM

## 2014-12-28 ENCOUNTER — Ambulatory Visit
Admission: RE | Admit: 2014-12-28 | Discharge: 2014-12-28 | Disposition: A | Discharge status: Home / Self Care | Payer: Federal, State, Local not specified - PPO | Source: Ambulatory Visit

## 2014-12-28 DIAGNOSIS — Z1231 Encounter for screening mammogram for malignant neoplasm of breast: Secondary | ICD-10-CM

## 2015-01-11 ENCOUNTER — Ambulatory Visit: Payer: Federal, State, Local not specified - PPO | Admitting: Podiatry

## 2015-01-13 ENCOUNTER — Ambulatory Visit: Payer: Federal, State, Local not specified - PPO | Admitting: Podiatry

## 2015-01-18 ENCOUNTER — Ambulatory Visit (INDEPENDENT_AMBULATORY_CARE_PROVIDER_SITE_OTHER): Payer: Federal, State, Local not specified - PPO | Admitting: Podiatry

## 2015-01-18 ENCOUNTER — Encounter: Payer: Self-pay | Admitting: Podiatry

## 2015-01-18 ENCOUNTER — Ambulatory Visit (INDEPENDENT_AMBULATORY_CARE_PROVIDER_SITE_OTHER): Payer: Federal, State, Local not specified - PPO

## 2015-01-18 VITALS — BP 110/66 | HR 52 | Resp 16

## 2015-01-18 DIAGNOSIS — M779 Enthesopathy, unspecified: Secondary | ICD-10-CM | POA: Diagnosis not present

## 2015-01-18 DIAGNOSIS — Z9889 Other specified postprocedural states: Secondary | ICD-10-CM

## 2015-01-19 NOTE — Progress Notes (Signed)
Subjective:     Patient ID: Sharon Obrien, female   DOB: 11/12/1955, 59 y.o.   MRN: [id number]  HPI patient presents with pain in the left foot that has improved quite substantially and is significantly better than before surgery   Review of Systems     Objective:   Physical Exam Neurovascular status intact with diminishment of discomfort second MPJ left and first MPJ left with minimal swelling noted    Assessment:     Improvement from inflammatory capsulitis first MPJ left    Plan:     Advised him more rigid-type shoes and reappoint if symptoms persist or get worse and reevaluated final x-rays

## 2015-10-14 ENCOUNTER — Ambulatory Visit (INDEPENDENT_AMBULATORY_CARE_PROVIDER_SITE_OTHER): Payer: Federal, State, Local not specified - PPO

## 2015-10-14 ENCOUNTER — Ambulatory Visit (INDEPENDENT_AMBULATORY_CARE_PROVIDER_SITE_OTHER): Payer: Federal, State, Local not specified - PPO | Admitting: Podiatry

## 2015-10-14 ENCOUNTER — Encounter: Payer: Self-pay | Admitting: Podiatry

## 2015-10-14 VITALS — BP 136/76 | HR 61 | Resp 16

## 2015-10-14 DIAGNOSIS — M779 Enthesopathy, unspecified: Secondary | ICD-10-CM

## 2015-10-14 DIAGNOSIS — Z472 Encounter for removal of internal fixation device: Secondary | ICD-10-CM

## 2015-10-14 DIAGNOSIS — M79672 Pain in left foot: Secondary | ICD-10-CM

## 2015-10-14 MED ORDER — DICLOFENAC SODIUM 75 MG PO TBEC: 75.0000 mg | DELAYED_RELEASE_TABLET | Freq: Two times a day (BID) | ORAL | Status: DC

## 2015-10-14 NOTE — Progress Notes (Signed)
Subjective:     Patient ID: Sharon Obrien, female   DOB: 09/03/1955, 60 y.o.   MRN: [id number]  HPI patient presents stating I'm still having some pain in my left foot especially in the middle the foot when I go barefoot. I still have a little bit of swelling and I'm getting discomfort on the first metatarsal shaft when I wear shoes   Review of Systems     Objective:   Physical Exam Neurovascular status intact muscle strength was adequate with range of motion within normal limits. Patient's left big toe motion is excellent with mild discomfort in the lesser MPJs with the incision sites that if healed very well with mild dorsal edema. There is prominence of the p.m. and on the dorsum of the left first metatarsal that is painful    Assessment:     Abnormal pin position left with mild inflammatory changes in the left foot secondary to surgery approximately 10-11 months ago    Plan:     H&P x-rays reviewed and recommended pin removal. Patient will be scheduled for this in the next several months after the summer pulses in and I also discussed not going barefoot and I placed on oral anti-inflammatory diclofenac to reduce the inflammation and will see back again in 10 weeks  X-ray report indicates excellent healing of the osteotomy first and second metatarsal left with screw in place and dorsal dislocation of the proximal.

## 2015-12-09 ENCOUNTER — Encounter: Payer: Self-pay | Admitting: Podiatry

## 2015-12-09 ENCOUNTER — Ambulatory Visit (INDEPENDENT_AMBULATORY_CARE_PROVIDER_SITE_OTHER): Payer: Federal, State, Local not specified - PPO | Admitting: Podiatry

## 2015-12-09 DIAGNOSIS — Z472 Encounter for removal of internal fixation device: Secondary | ICD-10-CM | POA: Diagnosis not present

## 2015-12-09 NOTE — Patient Instructions (Signed)
Pre-Operative Instructions  Congratulations, you have decided to take an important step to improving your quality of life.  You can be assured that the doctors of [facility] will be with you every step of the way.  1. Plan to be at the surgery center/hospital at least 1 (one) hour prior to your scheduled time unless otherwise directed by the surgical center/hospital staff.  You must have a responsible adult accompany you, remain during the surgery and drive you home.  Make sure you have directions to the surgical center/hospital and know how to get there on time. 2. For hospital based surgery you will need to obtain a history and physical form from your family physician within 1 month prior to the date of surgery- we will give you a form for you primary physician.  3. We make every effort to accommodate the date you request for surgery.  There are however, times where surgery dates or times have to be moved.  We will contact you as soon as possible if a change in schedule is required.   4. No Aspirin/Ibuprofen for one week before surgery.  If you are on aspirin, any non-steroidal anti-inflammatory medications (Mobic, Aleve, Ibuprofen) you should stop taking it 7 days prior to your surgery.  You make take Tylenol  For pain prior to surgery.  5. Medications- If you are taking daily heart and blood pressure medications, seizure, reflux, allergy, asthma, anxiety, pain or diabetes medications, make sure the surgery center/hospital is aware before the day of surgery so they may notify you which medications to take or avoid the day of surgery. 6. No food or drink after midnight the night before surgery unless directed otherwise by surgical center/hospital staff. 7. No alcoholic beverages 24 hours prior to surgery.  No smoking 24 hours prior to or 24 hours after surgery. 8. Wear loose pants or shorts- loose enough to fit over bandages, boots, and casts. 9. No slip on shoes, sneakers are best. 10. Bring  your boot with you to the surgery center/hospital.  Also bring crutches or a walker if your physician has prescribed it for you.  If you do not have this equipment, it will be provided for you after surgery. 11. If you have not been contracted by the surgery center/hospital by the day before your surgery, call to confirm the date and time of your surgery. 12. Leave-time from work may vary depending on the type of surgery you have.  Appropriate arrangements should be made prior to surgery with your employer. 13. Prescriptions will be provided immediately following surgery by your doctor.  Have these filled as soon as possible after surgery and take the medication as directed. 14. Remove nail polish on the operative foot. 15. Wash the night before surgery.  The night before surgery wash the foot and leg well with the antibacterial soap provided and water paying special attention to beneath the toenails and in between the toes.  Rinse thoroughly with water and dry well with a towel.  Perform this wash unless told not to do so by your physician.  Enclosed: 1 Ice pack (please put in freezer the night before surgery)   1 Hibiclens skin cleaner   Pre-op Instructions  If you have any questions regarding the instructions, do not hesitate to call our office.  [place]: [address] [place], [place] [address] [phone]  [place]: [address] [place], [place] [address] [phone]  [place]: [address]  [place], [place] [address] [phone]   Dr.   Kayzlee Wirtanen DPM, Dr. Matthew Wagoner DPM, Dr. M. Todd Hyatt DPM, Dr. Titorya Stover DPM 

## 2015-12-09 NOTE — Progress Notes (Signed)
Subjective:     Patient ID: Sharon Obrien, female   DOB: 07/29/1955, 60 y.o.   MRN: [id number]  HPI patient states that she still is getting pain in her forefoot left and she remembers that she had to have metal taken out of her chest when she had surgery   Review of Systems     Objective:   Physical Exam Neurovascular status intact negative Homans sign noted with patient found to have discomfort dorsal aspect second metatarsal left and into the first metatarsal shaft left with history of pins screw in these areas    Assessment:     Appears to be having possible reaction to metal with history of this in the past    Plan:     Reviewed condition and at this time I've recommended pin and screw removal. Explained procedure and risk and patient wants this done and I allowed her to read a consent form reviewing alternative treatments complications. Patient wants surgery signed consent form and is scheduled for outpatient surgery for removal of pin and screw

## 2015-12-10 ENCOUNTER — Telehealth: Payer: Self-pay | Admitting: *Deleted

## 2015-12-10 NOTE — Telephone Encounter (Signed)
"  I was just in there to see Dr. Charlsie Merlesegal about scheduling that surgery.  He told me he wanted to do it next week on next Tuesday.  Yet when I looked on this pamphlet, you had written down 09/26.  So now I'm confused about which Tuesday I am having surgery.  Can you please call me back?  As I was walking out he said okay, I will see you next Tuesday.  Call me back."  "Calling to clarify surgery date."

## 2015-12-10 NOTE — Telephone Encounter (Signed)
I am returning your call.  You are scheduled for Tuesday of next week.  "I know, I received a call from the surgical center and they told me to be there at 10:45 am.  Thanks so much for your help."

## 2015-12-14 ENCOUNTER — Encounter: Payer: Self-pay | Admitting: Podiatry

## 2015-12-14 DIAGNOSIS — Z4889 Encounter for other specified surgical aftercare: Secondary | ICD-10-CM | POA: Diagnosis not present

## 2015-12-17 NOTE — Progress Notes (Signed)
DOS 09.19.2017 Removal of Pin from Left 1st Metatarsal; Removal Screw From 2nd Metatarsal Left

## 2015-12-24 ENCOUNTER — Encounter: Payer: Self-pay | Admitting: Podiatry

## 2015-12-27 ENCOUNTER — Ambulatory Visit (INDEPENDENT_AMBULATORY_CARE_PROVIDER_SITE_OTHER): Payer: Federal, State, Local not specified - PPO | Admitting: Podiatry

## 2015-12-27 ENCOUNTER — Encounter: Payer: Self-pay | Admitting: Podiatry

## 2015-12-27 ENCOUNTER — Ambulatory Visit (INDEPENDENT_AMBULATORY_CARE_PROVIDER_SITE_OTHER): Payer: Federal, State, Local not specified - PPO

## 2015-12-27 VITALS — BP 104/57 | HR 54 | Temp 98.8°F

## 2015-12-27 DIAGNOSIS — M79672 Pain in left foot: Secondary | ICD-10-CM | POA: Diagnosis not present

## 2015-12-27 DIAGNOSIS — Z9889 Other specified postprocedural states: Secondary | ICD-10-CM

## 2015-12-28 NOTE — Progress Notes (Signed)
Subjective:     Patient ID: Sharon Obrien, female   DOB: 11/28/1955, 60 y.o.   MRN: [id number]  HPI patient states she's doing well with her left foot   Review of Systems     Objective:   Physical Exam Neurovascular status intact with incision site dorsal left foot healing well with wound edges well coapted    Assessment:     Doing well post removal of fixation left first and second metatarsal    Plan:     Reviewed condition and applied sterile dressing and stitches removed wound edges coapted well and applied Band-Aid therapy

## 2016-01-06 ENCOUNTER — Other Ambulatory Visit: Payer: Self-pay | Admitting: Family Medicine

## 2016-01-06 DIAGNOSIS — Z1231 Encounter for screening mammogram for malignant neoplasm of breast: Secondary | ICD-10-CM

## 2016-01-13 ENCOUNTER — Ambulatory Visit
Admission: RE | Admit: 2016-01-13 | Discharge: 2016-01-13 | Disposition: A | Discharge status: Home / Self Care | Payer: Federal, State, Local not specified - PPO | Source: Ambulatory Visit | Attending: Family Medicine | Admitting: Family Medicine

## 2016-01-13 ENCOUNTER — Other Ambulatory Visit: Payer: Self-pay | Admitting: Family Medicine

## 2016-01-13 DIAGNOSIS — Z1231 Encounter for screening mammogram for malignant neoplasm of breast: Secondary | ICD-10-CM

## 2016-10-19 ENCOUNTER — Encounter: Payer: Self-pay | Admitting: Podiatry

## 2016-10-19 ENCOUNTER — Ambulatory Visit (INDEPENDENT_AMBULATORY_CARE_PROVIDER_SITE_OTHER): Payer: Federal, State, Local not specified - PPO

## 2016-10-19 ENCOUNTER — Ambulatory Visit (INDEPENDENT_AMBULATORY_CARE_PROVIDER_SITE_OTHER): Payer: Federal, State, Local not specified - PPO | Admitting: Podiatry

## 2016-10-19 DIAGNOSIS — M79672 Pain in left foot: Secondary | ICD-10-CM | POA: Diagnosis not present

## 2016-10-19 DIAGNOSIS — M778 Other enthesopathies, not elsewhere classified: Secondary | ICD-10-CM

## 2016-10-19 DIAGNOSIS — M779 Enthesopathy, unspecified: Secondary | ICD-10-CM | POA: Diagnosis not present

## 2016-10-19 DIAGNOSIS — M7752 Other enthesopathy of left foot: Secondary | ICD-10-CM

## 2016-10-19 MED ORDER — TRIAMCINOLONE ACETONIDE 10 MG/ML IJ SUSP
10.0000 mg | Freq: Once | INTRAMUSCULAR | Status: AC
  Administered 2016-10-19: 10 mg

## 2016-10-20 NOTE — Progress Notes (Signed)
Subjective:    Patient ID: Sharon Obrien, female   DOB: 61 y.o.   MRN: [id number]   HPI patient presents stating I'm getting a lot of pain on the inside of his left big toe joint and it just started over the last few months and I wanted to get it checked    ROS      Objective:  Physical Exam neurovascular status intact muscle strength was adequate range of motion within normal limits with patient found to have exquisite discomfort in the left first MPJ just on the lateral side with good range of motion of the joint and no crepitus of the first MPJ     Assessment:    Inflammatory changes of the left first MPJ with capsular inflammation     Plan:    H&P condition reviewed and at this point I explained there may be a mild arthritis of the inner side of the first metatarsal phalangeal joint and I did do careful capsular injection to see response. Patient be seen back when symptomatic  X-rays indicate excellent healing from previous surgery with a small amount of reactivity on the inside of the left first MPJ left with spur formation

## 2016-12-27 ENCOUNTER — Other Ambulatory Visit: Payer: Self-pay | Admitting: Family Medicine

## 2016-12-27 DIAGNOSIS — Z1239 Encounter for other screening for malignant neoplasm of breast: Secondary | ICD-10-CM

## 2017-01-16 ENCOUNTER — Ambulatory Visit: Payer: Federal, State, Local not specified - PPO

## 2017-01-31 ENCOUNTER — Ambulatory Visit
Admission: RE | Admit: 2017-01-31 | Discharge: 2017-01-31 | Disposition: A | Discharge status: Home / Self Care | Payer: Federal, State, Local not specified - PPO | Source: Ambulatory Visit | Attending: Family Medicine | Admitting: Family Medicine

## 2017-01-31 DIAGNOSIS — Z1239 Encounter for other screening for malignant neoplasm of breast: Secondary | ICD-10-CM

## 2017-02-02 ENCOUNTER — Other Ambulatory Visit: Payer: Self-pay | Admitting: Family Medicine

## 2017-02-02 DIAGNOSIS — R928 Other abnormal and inconclusive findings on diagnostic imaging of breast: Secondary | ICD-10-CM

## 2017-02-05 ENCOUNTER — Ambulatory Visit
Admission: RE | Admit: 2017-02-05 | Discharge: 2017-02-05 | Disposition: A | Discharge status: Home / Self Care | Payer: Federal, State, Local not specified - PPO | Source: Ambulatory Visit | Attending: Family Medicine | Admitting: Family Medicine

## 2017-02-05 ENCOUNTER — Ambulatory Visit: Payer: Federal, State, Local not specified - PPO

## 2017-02-05 DIAGNOSIS — R928 Other abnormal and inconclusive findings on diagnostic imaging of breast: Secondary | ICD-10-CM

## 2018-02-12 ENCOUNTER — Other Ambulatory Visit: Payer: Self-pay | Admitting: Family Medicine

## 2018-02-12 DIAGNOSIS — Z1231 Encounter for screening mammogram for malignant neoplasm of breast: Secondary | ICD-10-CM

## 2018-02-13 ENCOUNTER — Ambulatory Visit
Admission: RE | Admit: 2018-02-13 | Discharge: 2018-02-13 | Disposition: A | Discharge status: Home / Self Care | Payer: Federal, State, Local not specified - PPO | Source: Ambulatory Visit | Attending: Family Medicine | Admitting: Family Medicine

## 2018-02-13 DIAGNOSIS — Z1231 Encounter for screening mammogram for malignant neoplasm of breast: Secondary | ICD-10-CM

## 2018-06-03 ENCOUNTER — Ambulatory Visit
Admission: RE | Admit: 2018-06-03 | Discharge: 2018-06-03 | Disposition: A | Discharge status: Home / Self Care | Payer: Federal, State, Local not specified - PPO | Source: Ambulatory Visit | Attending: Family Medicine | Admitting: Family Medicine

## 2018-06-03 ENCOUNTER — Other Ambulatory Visit: Payer: Self-pay | Admitting: Family Medicine

## 2018-06-03 DIAGNOSIS — J189 Pneumonia, unspecified organism: Secondary | ICD-10-CM

## 2019-02-10 ENCOUNTER — Other Ambulatory Visit: Payer: Self-pay | Admitting: Family Medicine

## 2019-02-10 DIAGNOSIS — Z1231 Encounter for screening mammogram for malignant neoplasm of breast: Secondary | ICD-10-CM

## 2019-04-04 ENCOUNTER — Ambulatory Visit
Admission: RE | Admit: 2019-04-04 | Discharge: 2019-04-04 | Disposition: A | Discharge status: Home / Self Care | Payer: Federal, State, Local not specified - PPO | Source: Ambulatory Visit | Attending: Family Medicine | Admitting: Family Medicine

## 2019-04-04 ENCOUNTER — Other Ambulatory Visit: Payer: Self-pay

## 2019-04-04 DIAGNOSIS — Z1231 Encounter for screening mammogram for malignant neoplasm of breast: Secondary | ICD-10-CM

## 2019-04-08 ENCOUNTER — Other Ambulatory Visit: Payer: Self-pay | Admitting: Family Medicine

## 2019-04-08 DIAGNOSIS — R928 Other abnormal and inconclusive findings on diagnostic imaging of breast: Secondary | ICD-10-CM

## 2019-04-22 ENCOUNTER — Other Ambulatory Visit: Payer: Self-pay

## 2019-04-22 ENCOUNTER — Ambulatory Visit
Admission: RE | Admit: 2019-04-22 | Discharge: 2019-04-22 | Disposition: A | Discharge status: Home / Self Care | Payer: Federal, State, Local not specified - PPO | Source: Ambulatory Visit | Attending: Family Medicine | Admitting: Family Medicine

## 2019-04-22 DIAGNOSIS — R928 Other abnormal and inconclusive findings on diagnostic imaging of breast: Secondary | ICD-10-CM

## 2020-04-28 ENCOUNTER — Telehealth: Payer: Self-pay

## 2020-04-28 NOTE — Telephone Encounter (Signed)
NOTES ON FILE EAGLE AT [facility] [phone], SENT REFERRAL TO SCHEDULING

## 2020-05-20 ENCOUNTER — Ambulatory Visit: Payer: Federal, State, Local not specified - PPO | Admitting: Internal Medicine
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
## 2020-06-03 ENCOUNTER — Other Ambulatory Visit: Payer: Self-pay

## 2020-06-03 ENCOUNTER — Encounter: Payer: Self-pay | Admitting: Cardiology

## 2020-06-03 ENCOUNTER — Ambulatory Visit (INDEPENDENT_AMBULATORY_CARE_PROVIDER_SITE_OTHER): Payer: Medicare Other | Admitting: Cardiology

## 2020-06-03 VITALS — BP 144/82 | HR 78 | Ht 60.0 in | Wt 176.8 lb

## 2020-06-03 DIAGNOSIS — R002 Palpitations: Secondary | ICD-10-CM

## 2020-06-03 DIAGNOSIS — E7800 Pure hypercholesterolemia, unspecified: Secondary | ICD-10-CM | POA: Diagnosis not present

## 2020-06-03 DIAGNOSIS — I4510 Unspecified right bundle-branch block: Secondary | ICD-10-CM | POA: Diagnosis not present

## 2020-06-03 DIAGNOSIS — I319 Disease of pericardium, unspecified: Secondary | ICD-10-CM | POA: Diagnosis not present

## 2020-06-03 MED ORDER — ROSUVASTATIN CALCIUM 10 MG PO TABS: 10.0000 mg | ORAL_TABLET | Freq: Every day | ORAL | 3 refills | Status: DC

## 2020-06-03 NOTE — Patient Instructions (Addendum)
Medication Instructions:  Your physician has recommended you make the following change in your medication:  1-START Rosuvastatin (Crestor) 10 mg by mouth daily.  *If you need a refill on your cardiac medications before your next appointment, please call your pharmacy*  Lab Work: Your physician recommends that you return for lab work in: 6 weeks for fasting lipid panel.  If you have labs (blood work) drawn today and your tests are completely normal, you will receive your results only by: Marland Kitchen MyChart Message (if you have MyChart) OR . A paper copy in the mail If you have any lab test that is abnormal or we need to change your treatment, we will call you to review the results.  Testing/Procedures: Your physician has requested that you have an echocardiogram. Echocardiography is a painless test that uses sound waves to create images of your heart. It provides your doctor with information about the size and shape of your heart and how well your heart's chambers and valves are working. This procedure takes approximately one hour. There are no restrictions for this procedure.  Follow-Up: At [facility], you and your health needs are our priority.  As part of our continuing mission to provide you with exceptional heart care, we have created designated Provider Care Teams.  These Care Teams include your primary Cardiologist (physician) and Advanced Practice Providers (APPs -  Physician Assistants and Nurse Practitioners) who all work together to provide you with the care you need, when you need it.  We recommend signing up for the patient portal called "MyChart".  Sign up information is provided on this After Visit Summary.  MyChart is used to connect with patients for Virtual Visits (Telemedicine).  Patients are able to view lab/test results, encounter notes, upcoming appointments, etc.  Non-urgent messages can be sent to your provider as well.   To learn more about what you can do with MyChart, go to  [email or web address].    Your next appointment:   6 month(s)  The format for your next appointment:   In Person  Provider:   You may see Dr. Shari Prows or one of the following Advanced Practice Providers on your designated Care Team:    Tereso Newcomer, PA-C  [place], [place]

## 2020-06-29 ENCOUNTER — Other Ambulatory Visit: Payer: Self-pay | Admitting: Family Medicine

## 2020-06-29 DIAGNOSIS — Z1231 Encounter for screening mammogram for malignant neoplasm of breast: Secondary | ICD-10-CM

## 2020-07-16 ENCOUNTER — Other Ambulatory Visit: Payer: Medicare Other | Admitting: *Deleted

## 2020-07-16 ENCOUNTER — Other Ambulatory Visit: Payer: Self-pay | Admitting: Cardiology

## 2020-07-16 ENCOUNTER — Other Ambulatory Visit: Payer: Self-pay

## 2020-07-16 ENCOUNTER — Ambulatory Visit (HOSPITAL_COMMUNITY): Payer: Medicare Other | Attending: Internal Medicine

## 2020-07-16 DIAGNOSIS — I319 Disease of pericardium, unspecified: Secondary | ICD-10-CM

## 2020-07-16 DIAGNOSIS — I4510 Unspecified right bundle-branch block: Secondary | ICD-10-CM

## 2020-07-16 LAB — ECHOCARDIOGRAM COMPLETE
Area-P 1/2: 1.89 cm2
S' Lateral: 3.2 cm

## 2020-07-19 LAB — LIPID PANEL
Chol/HDL Ratio: 2.4 ratio (ref 0.0–4.4)
Cholesterol, Total: 152 mg/dL (ref 100–199)
HDL: 64 mg/dL (ref >39)
LDL Chol Calc (NIH): 73 mg/dL (ref 0–99)
Triglycerides: 81 mg/dL (ref 0–149)
VLDL Cholesterol Cal: 15 mg/dL (ref 5–40)

## 2020-07-26 ENCOUNTER — Encounter: Payer: Self-pay | Admitting: *Deleted

## 2020-08-02 ENCOUNTER — Telehealth: Payer: Self-pay | Admitting: Cardiology

## 2020-08-02 NOTE — Telephone Encounter (Signed)
Returned call to Pt.  Echo results given.  Discussed symptoms to be watchful for for pericarditis pain vs her chronic pain.  Pt thanked nurse for call back.

## 2020-08-02 NOTE — Telephone Encounter (Signed)
Pt called in returning [place] call about her echo .  Best number [phone]

## 2020-09-09 ENCOUNTER — Other Ambulatory Visit: Payer: Self-pay

## 2020-09-09 ENCOUNTER — Ambulatory Visit: Payer: Federal, State, Local not specified - PPO

## 2020-09-09 ENCOUNTER — Ambulatory Visit
Admission: RE | Admit: 2020-09-09 | Discharge: 2020-09-09 | Disposition: A | Discharge status: Home / Self Care | Payer: Medicare Other | Source: Ambulatory Visit | Attending: Family Medicine | Admitting: Family Medicine

## 2020-09-09 DIAGNOSIS — Z1231 Encounter for screening mammogram for malignant neoplasm of breast: Secondary | ICD-10-CM

## 2020-11-16 ENCOUNTER — Other Ambulatory Visit: Payer: Self-pay | Admitting: Physical Medicine and Rehabilitation

## 2020-11-16 DIAGNOSIS — M47816 Spondylosis without myelopathy or radiculopathy, lumbar region: Secondary | ICD-10-CM

## 2020-12-11 ENCOUNTER — Ambulatory Visit
Admission: RE | Admit: 2020-12-11 | Discharge: 2020-12-11 | Disposition: A | Discharge status: Home / Self Care | Payer: Medicare Other | Source: Ambulatory Visit | Attending: Physical Medicine and Rehabilitation | Admitting: Physical Medicine and Rehabilitation

## 2020-12-11 DIAGNOSIS — M47816 Spondylosis without myelopathy or radiculopathy, lumbar region: Secondary | ICD-10-CM

## 2021-05-18 ENCOUNTER — Other Ambulatory Visit: Payer: Self-pay | Admitting: *Deleted

## 2021-05-18 MED ORDER — ROSUVASTATIN CALCIUM 10 MG PO TABS: 10.0000 mg | ORAL_TABLET | Freq: Every day | ORAL | 3 refills | Status: DC
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
## 2021-07-19 ENCOUNTER — Ambulatory Visit (INDEPENDENT_AMBULATORY_CARE_PROVIDER_SITE_OTHER): Payer: Medicare Other | Admitting: Cardiology

## 2021-07-19 ENCOUNTER — Encounter: Payer: Self-pay | Admitting: Cardiology

## 2021-07-19 VITALS — BP 116/88 | HR 65 | Ht 60.0 in | Wt 186.4 lb

## 2021-07-19 DIAGNOSIS — E7800 Pure hypercholesterolemia, unspecified: Secondary | ICD-10-CM | POA: Diagnosis not present

## 2021-07-19 DIAGNOSIS — I319 Disease of pericardium, unspecified: Secondary | ICD-10-CM | POA: Diagnosis not present

## 2021-07-19 DIAGNOSIS — R002 Palpitations: Secondary | ICD-10-CM | POA: Diagnosis not present

## 2021-07-19 DIAGNOSIS — R0609 Other forms of dyspnea: Secondary | ICD-10-CM

## 2021-07-19 DIAGNOSIS — I4510 Unspecified right bundle-branch block: Secondary | ICD-10-CM

## 2021-07-19 NOTE — Patient Instructions (Signed)
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
## 2021-11-24 ENCOUNTER — Other Ambulatory Visit: Payer: Self-pay | Admitting: Family Medicine

## 2021-11-24 DIAGNOSIS — Z1231 Encounter for screening mammogram for malignant neoplasm of breast: Secondary | ICD-10-CM

## 2022-01-03 ENCOUNTER — Ambulatory Visit
Admission: RE | Admit: 2022-01-03 | Discharge: 2022-01-03 | Disposition: A | Discharge status: Home / Self Care | Payer: Medicare Other | Source: Ambulatory Visit | Attending: Family Medicine | Admitting: Family Medicine

## 2022-01-03 DIAGNOSIS — Z1231 Encounter for screening mammogram for malignant neoplasm of breast: Secondary | ICD-10-CM
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
## 2022-01-16 ENCOUNTER — Ambulatory Visit: Payer: Medicare Other | Attending: Cardiology | Admitting: Cardiology

## 2022-01-16 ENCOUNTER — Encounter: Payer: Self-pay | Admitting: Cardiology

## 2022-01-16 VITALS — BP 120/80 | HR 78 | Ht 60.0 in | Wt 184.6 lb

## 2022-01-16 DIAGNOSIS — E7800 Pure hypercholesterolemia, unspecified: Secondary | ICD-10-CM | POA: Insufficient documentation

## 2022-01-16 DIAGNOSIS — R002 Palpitations: Secondary | ICD-10-CM | POA: Insufficient documentation

## 2022-01-16 DIAGNOSIS — R0609 Other forms of dyspnea: Secondary | ICD-10-CM | POA: Diagnosis present

## 2022-01-16 DIAGNOSIS — I319 Disease of pericardium, unspecified: Secondary | ICD-10-CM | POA: Insufficient documentation

## 2022-01-16 DIAGNOSIS — I4510 Unspecified right bundle-branch block: Secondary | ICD-10-CM | POA: Diagnosis present

## 2022-01-16 DIAGNOSIS — I3139 Other pericardial effusion (noninflammatory): Secondary | ICD-10-CM | POA: Diagnosis present
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
## 2022-01-16 NOTE — Patient Instructions (Signed)
Medication Instructions:   Your physician recommends that you continue on your current medications as directed. Please refer to the Current Medication list given to you today.  *If you need a refill on your cardiac medications before your next appointment, please call your pharmacy*   Testing/Procedures:  Your physician has requested that you have an echocardiogram. Echocardiography is a painless test that uses sound waves to create images of your heart. It provides your doctor with information about the size and shape of your heart and how well your heart's chambers and valves are working. This procedure takes approximately one hour. There are no restrictions for this procedure. Please do NOT wear cologne, perfume, aftershave, or lotions (deodorant is allowed). Please arrive 15 minutes prior to your appointment time.    Follow-Up: At [facility], you and your health needs are our priority.  As part of our continuing mission to provide you with exceptional heart care, we have created designated Provider Care Teams.  These Care Teams include your primary Cardiologist (physician) and Advanced Practice Providers (APPs -  Physician Assistants and Nurse Practitioners) who all work together to provide you with the care you need, when you need it.  We recommend signing up for the patient portal called "MyChart".  Sign up information is provided on this After Visit Summary.  MyChart is used to connect with patients for Virtual Visits (Telemedicine).  Patients are able to view lab/test results, encounter notes, upcoming appointments, etc.  Non-urgent messages can be sent to your provider as well.   To learn more about what you can do with MyChart, go to [email or web address].    Your next appointment:   6 month(s)  The format for your next appointment:   In Person  Provider:   Robbie Lis, PA-C, Nicholes Rough, PA-C, Melina Copa, PA-C, Ambrose Pancoast, NP, Cecilie Kicks, NP, Ermalinda Barrios, PA-C,  Christen Bame, NP, or Richardson Dopp, PA-C          Important Information About Sugar

## 2022-01-19 IMAGING — MR MR LUMBAR SPINE W/O CM
4 of 5 series · 28 of 48 positions shown · non-contrast
Comparison: 05/18/2011

CLINICAL DATA: Low back pain exacerbated by walking and present for
several years

EXAM:
MRI LUMBAR SPINE WITHOUT CONTRAST
TECHNIQUE: Multiplanar, multisequence MR imaging of the lumbar spine was
performed. No intravenous contrast was administered.

[Series 4: T2 · sagittal · 4.0mm · 1.09mm/px · 6 of 17 slices shown (1 of 2)]
[im 1/17]
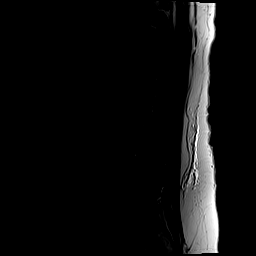
[im 4/17]
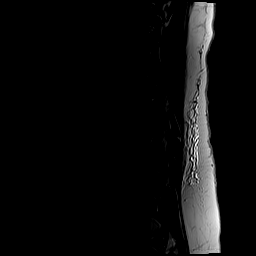
[im 7/17]
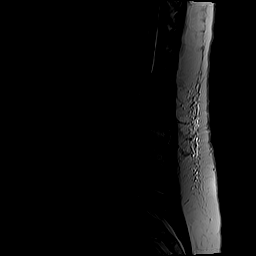
[im 10/17]
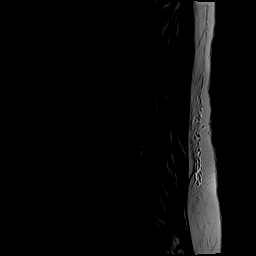
[im 13/17]
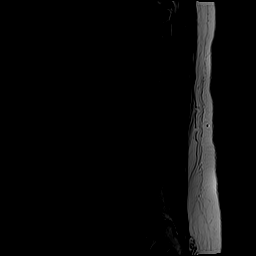
[im 17/17]
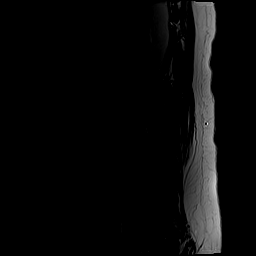

[Series 6: T1 · sagittal · 4.0mm · 1.09mm/px · 7 of 17 slices shown (1 of 2)]
[im 1/17]
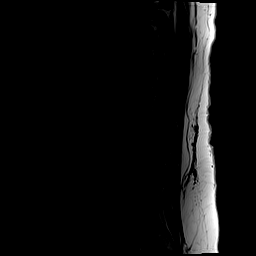
[im 3/17]
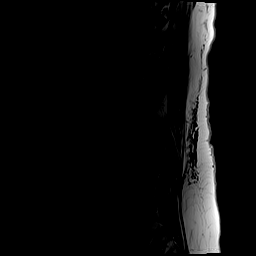
[im 6/17]
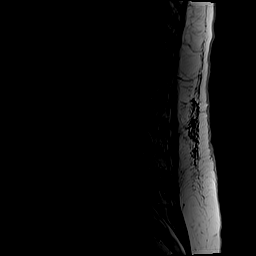
[im 9/17]
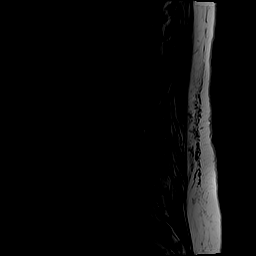
[im 11/17]
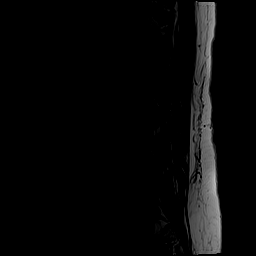
[im 14/17]
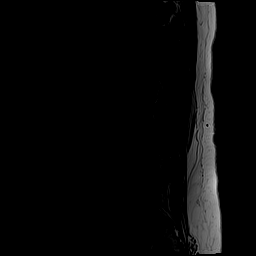
[im 17/17]
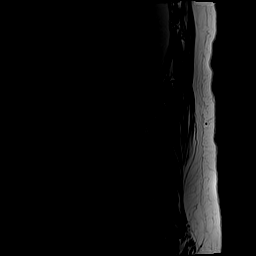

[Series 7: T2 · axial · 4.0mm · 0.39mm/px · z∈[-76,+111]mm · 8 of 35 slices shown (2 of 2)]
[im 1/35]
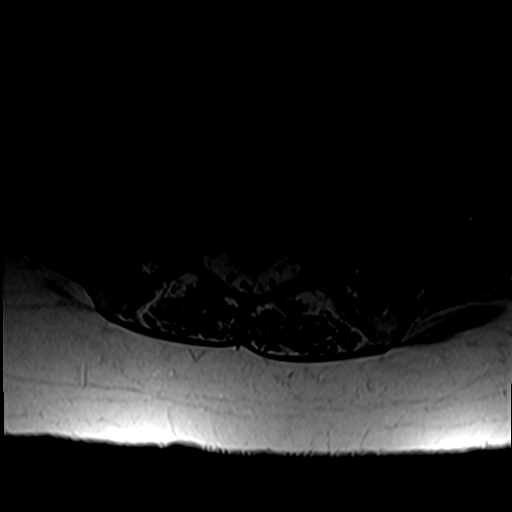
[im 6/35]
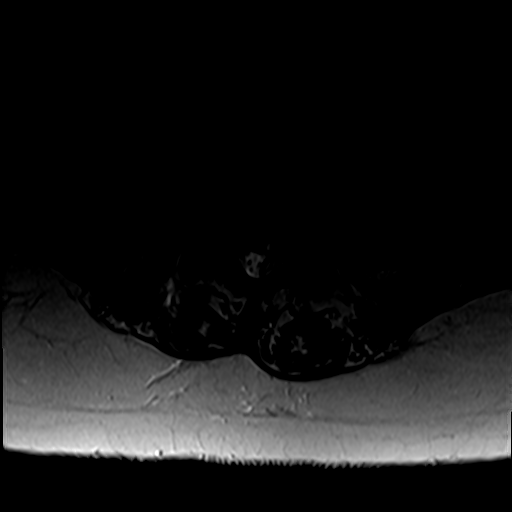
[im 11/35]
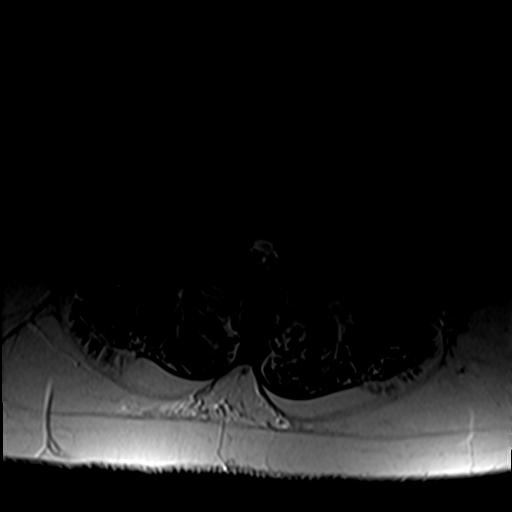
[im 16/35]
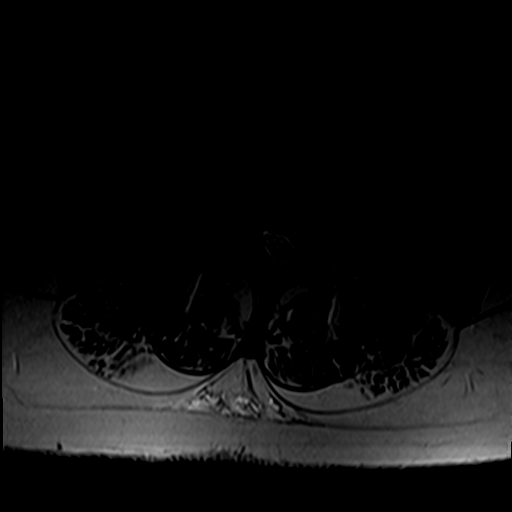
[im 19/35]
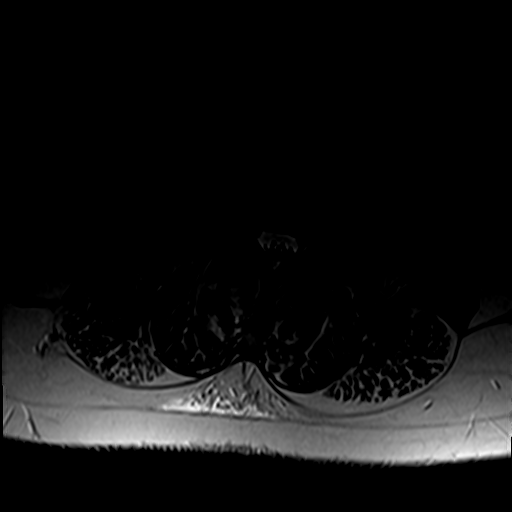
[im 24/35]
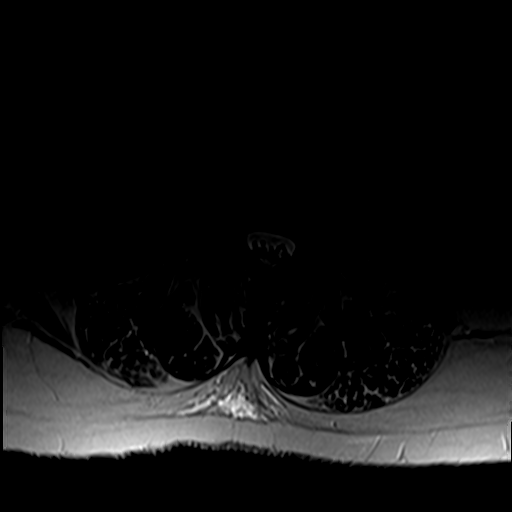
[im 29/35]
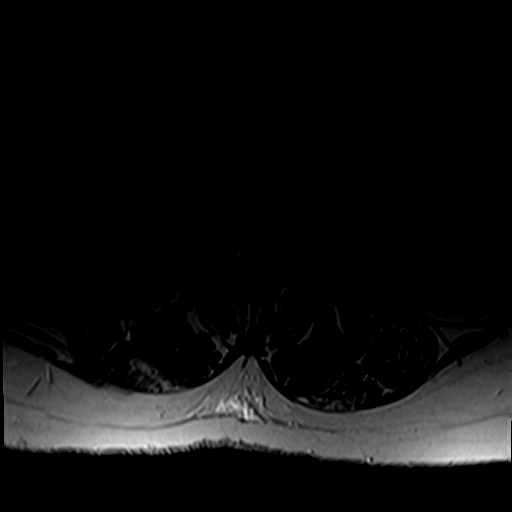
[im 35/35]
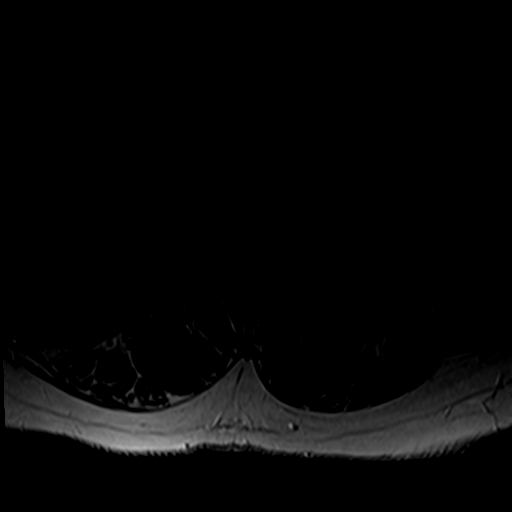

[Series 8: T1 · axial · 4.0mm · 0.39mm/px · z∈[-76,+82]mm · 7 of 35 slices shown (2 of 2)]
[im 1/35]
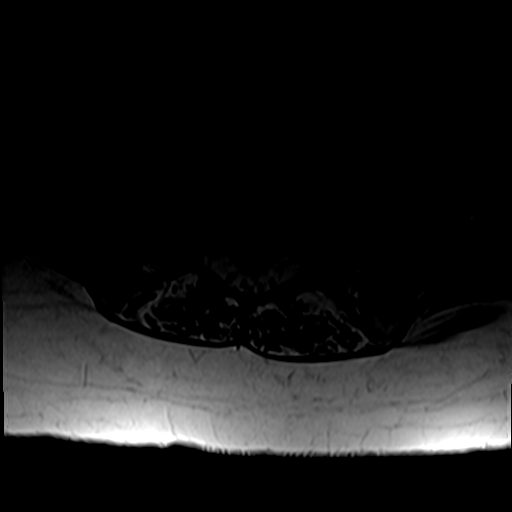
[im 6/35]
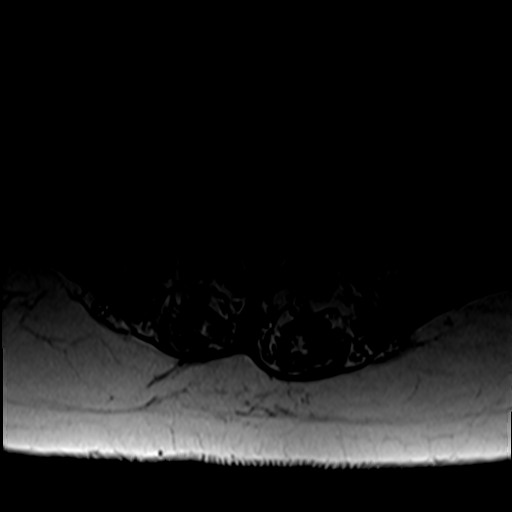
[im 11/35]
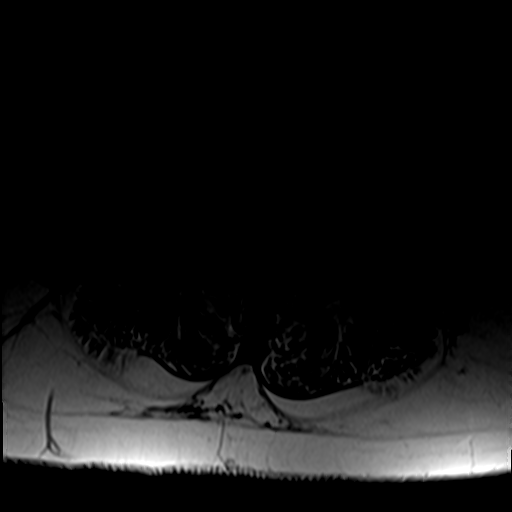
[im 16/35]
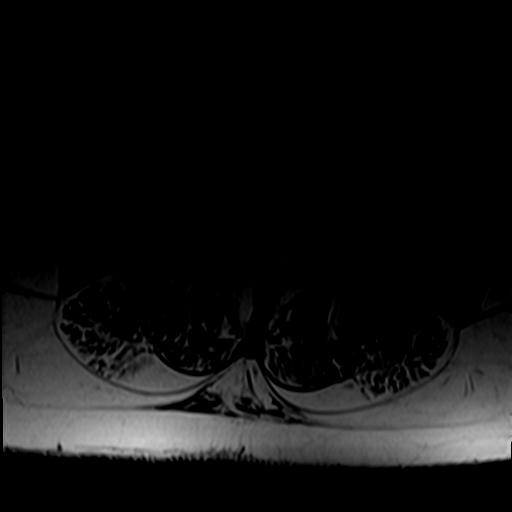
[im 19/35]
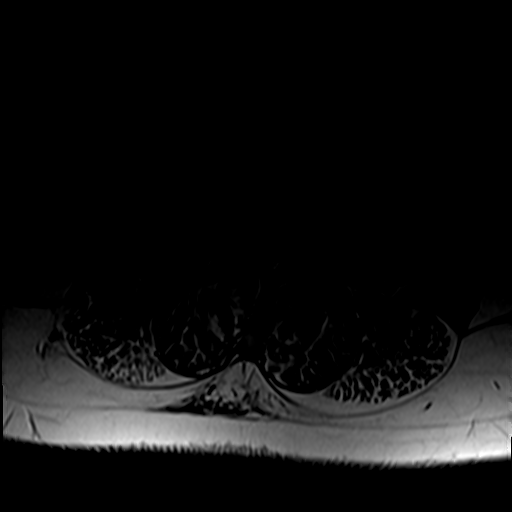
[im 24/35]
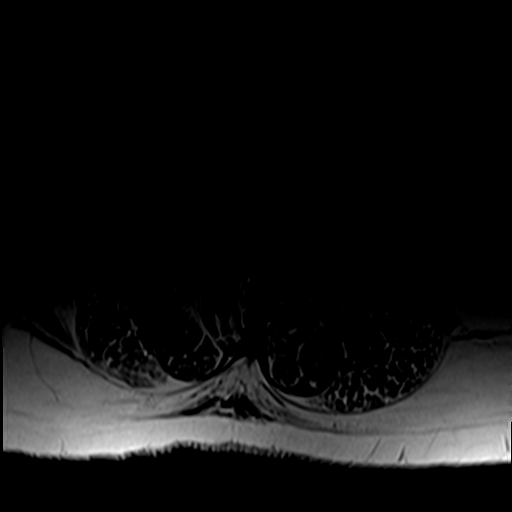
[im 29/35]
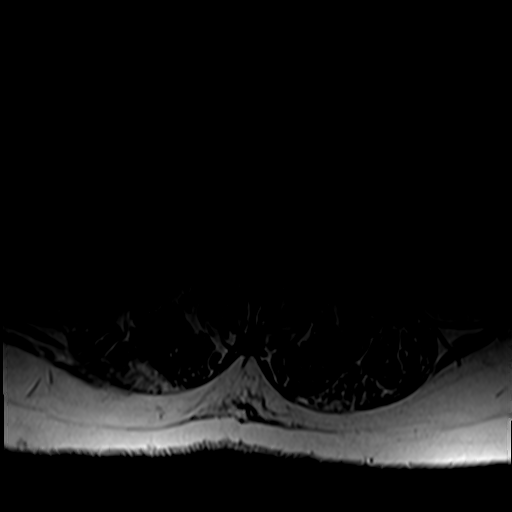

[28 of 48 positions shown; findings below may reference images not displayed]

FINDINGS: Segmentation: 5 lumbar type vertebrae based on the available
coverage.

Alignment:  Levoscoliosis.

Vertebrae:  Discogenic sclerosis at L3-4

Conus medullaris and cauda equina: Conus extends to the L1 level.
Conus and cauda equina appear normal.

Paraspinal and other soft tissues: No evidence of perispinal mass or
inflammation. Mild atrophy of intrinsic back muscles.

Disc levels:

T12- L1: Unremarkable.

L1-L2: Mild left eccentric disc bulging.

L2-L3: Disc narrowing and endplate ridging with circumferential
bulging. No neural compression

L3-L4: Progressive disc collapse and endplate degeneration with
eccentric right degenerative sclerosis. Disc bulging and endplate
spurring causes progressed and moderate thecal sac encroachment.
Noncompressive right foraminal narrowing which is mild-to-moderate

L4-L5: Disc narrowing and bulging with small downward pointing right
paracentral protrusion suggested on sagittal images, chronic. Facet
osteoarthritis with moderate spurring. Patent canal and foramina.

L5-S1:Disc narrowing and bulging. Negative facets. No neural
compression.
IMPRESSION: Generalized lumbar spine degeneration with mild scoliosis. Lumbar
spine degeneration has progressed from 1140, especially at the L3-4
disc space.

L3-4 moderate spinal stenosis and mild-to-moderate right foraminal
narrowing

## 2022-02-02 ENCOUNTER — Ambulatory Visit (HOSPITAL_COMMUNITY): Payer: Medicare Other | Attending: Cardiology

## 2022-02-02 DIAGNOSIS — I319 Disease of pericardium, unspecified: Secondary | ICD-10-CM | POA: Diagnosis present

## 2022-02-02 DIAGNOSIS — I3139 Other pericardial effusion (noninflammatory): Secondary | ICD-10-CM | POA: Diagnosis present

## 2022-02-02 LAB — ECHOCARDIOGRAM COMPLETE
Area-P 1/2: 2.02 cm2
S' Lateral: 2.9 cm

## 2022-04-20 ENCOUNTER — Other Ambulatory Visit: Payer: Self-pay | Admitting: *Deleted

## 2022-04-20 MED ORDER — ROSUVASTATIN CALCIUM 10 MG PO TABS: 10.0000 mg | ORAL_TABLET | Freq: Every day | ORAL | 0 refills | Status: DC

## 2022-07-17 ENCOUNTER — Other Ambulatory Visit: Payer: Self-pay

## 2022-07-17 MED ORDER — ROSUVASTATIN CALCIUM 10 MG PO TABS: 10.0000 mg | ORAL_TABLET | Freq: Every day | ORAL | 1 refills | Status: DC

## 2023-01-19 ENCOUNTER — Other Ambulatory Visit: Payer: Self-pay

## 2023-01-19 MED ORDER — ROSUVASTATIN CALCIUM 10 MG PO TABS: 10.0000 mg | ORAL_TABLET | Freq: Every day | ORAL | 0 refills | Status: DC

## 2023-02-17 ENCOUNTER — Other Ambulatory Visit: Payer: Self-pay | Admitting: Family Medicine

## 2023-02-17 DIAGNOSIS — Z1231 Encounter for screening mammogram for malignant neoplasm of breast: Secondary | ICD-10-CM

## 2023-02-26 ENCOUNTER — Ambulatory Visit
Admission: RE | Admit: 2023-02-26 | Discharge: 2023-02-26 | Disposition: A | Discharge status: Home / Self Care | Payer: Medicare Other | Source: Ambulatory Visit

## 2023-02-26 DIAGNOSIS — Z1231 Encounter for screening mammogram for malignant neoplasm of breast: Secondary | ICD-10-CM

## 2023-02-28 ENCOUNTER — Other Ambulatory Visit: Payer: Self-pay

## 2023-02-28 ENCOUNTER — Telehealth: Payer: Self-pay | Admitting: Cardiology

## 2023-02-28 MED ORDER — ROSUVASTATIN CALCIUM 10 MG PO TABS: 10.0000 mg | ORAL_TABLET | Freq: Every day | ORAL | 0 refills | Status: DC

## 2023-02-28 NOTE — Telephone Encounter (Signed)
 RX sent to requested Pharmacy

## 2023-02-28 NOTE — Telephone Encounter (Signed)
*  STAT* If patient is at the pharmacy, call can be transferred to refill team.   1. Which medications need to be refilled? (please list name of each medication and dose if known)  rosuvastatin (CRESTOR) 10 MG tablet  2. Which pharmacy/location (including street and city if local pharmacy) is medication to be sent to? [id number] - [place], [place] [address]  3. Do they need a 30 day or 90 day supply? [place]

## 2023-03-12 ENCOUNTER — Other Ambulatory Visit: Payer: Self-pay | Admitting: Physician Assistant

## 2023-03-13 ENCOUNTER — Other Ambulatory Visit: Payer: Self-pay | Admitting: Family Medicine

## 2023-03-13 DIAGNOSIS — E2839 Other primary ovarian failure: Secondary | ICD-10-CM

## 2023-06-19 ENCOUNTER — Ambulatory Visit: Payer: Medicare Other | Admitting: Cardiology

## 2023-09-07 ENCOUNTER — Ambulatory Visit: Admitting: Cardiology

## 2024-05-01 ENCOUNTER — Other Ambulatory Visit: Payer: Self-pay | Admitting: Family Medicine

## 2024-05-01 DIAGNOSIS — Z1231 Encounter for screening mammogram for malignant neoplasm of breast: Secondary | ICD-10-CM

## 2024-05-02 ENCOUNTER — Ambulatory Visit

## 2024-05-27 ENCOUNTER — Ambulatory Visit
